# Patient Record
Sex: Female | Born: 1995 | Hispanic: Yes | Marital: Single | Smoking: Former smoker
Health system: Southern US, Community
[De-identification: ages and names within clinical notes are randomized; demographics above are authoritative.]

## PROBLEM LIST (undated history)

## (undated) ENCOUNTER — Inpatient Hospital Stay (HOSPITAL_COMMUNITY): Payer: Self-pay

## (undated) DIAGNOSIS — J45909 Unspecified asthma, uncomplicated: Secondary | ICD-10-CM

## (undated) DIAGNOSIS — R87629 Unspecified abnormal cytological findings in specimens from vagina: Secondary | ICD-10-CM

---
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
## 2018-05-31 ENCOUNTER — Encounter (HOSPITAL_COMMUNITY): Payer: Self-pay

## 2018-05-31 ENCOUNTER — Inpatient Hospital Stay (HOSPITAL_COMMUNITY)
Admission: AD | Admit: 2018-05-31 | Discharge: 2018-05-31 | Disposition: A | Discharge status: Home / Self Care | Payer: Medicaid Other | Source: Ambulatory Visit | Attending: Obstetrics & Gynecology | Admitting: Obstetrics & Gynecology

## 2018-05-31 DIAGNOSIS — Z3A29 29 weeks gestation of pregnancy: Secondary | ICD-10-CM | POA: Diagnosis not present

## 2018-05-31 DIAGNOSIS — O26893 Other specified pregnancy related conditions, third trimester: Secondary | ICD-10-CM | POA: Diagnosis not present

## 2018-05-31 DIAGNOSIS — R109 Unspecified abdominal pain: Secondary | ICD-10-CM | POA: Diagnosis not present

## 2018-05-31 DIAGNOSIS — O26899 Other specified pregnancy related conditions, unspecified trimester: Secondary | ICD-10-CM | POA: Diagnosis not present

## 2018-05-31 DIAGNOSIS — M549 Dorsalgia, unspecified: Secondary | ICD-10-CM | POA: Diagnosis present

## 2018-05-31 LAB — URINALYSIS, ROUTINE W REFLEX MICROSCOPIC
BILIRUBIN URINE: NEGATIVE
Glucose, UA: NEGATIVE mg/dL
HGB URINE DIPSTICK: NEGATIVE
Ketones, ur: NEGATIVE mg/dL
Leukocytes, UA: NEGATIVE
Nitrite: NEGATIVE
Protein, ur: NEGATIVE mg/dL
SPECIFIC GRAVITY, URINE: 1.017 (ref 1.005–1.030)
pH: 7 (ref 5.0–8.0)

## 2018-05-31 NOTE — MAU Provider Note (Signed)
Chief Complaint:  Abdominal Pain and Back Pain   First Provider Initiated Contact with Patient 05/31/18 1649     HPI: [name] is a 22 y.o. G1P0 at 4958w4d who presents to maternity admissions reporting lower abdominal cramping & low back pain. Symptoms began yesterday after her OB visit at [facility]. Symptoms are intermittently and do not occur more than once per hour. Vomited once this morning after she ate hibachi. Denies n/v since then. Denies dysuria, fever/chills, vaginal bleeding, vaginal discharge, diarrhea, constipation, or recent intercourse. Normal fetal movement.   Location: lower abdomen & back Quality: cramping Severity: 5/10 in pain scale Duration: 1 day Timing: a few times per day Modifying factors: none Associated signs and symptoms: none  History reviewed. No pertinent past medical history. OB History  Gravida Para Term Preterm AB Living  1            SAB TAB Ectopic Multiple Live Births               # Outcome Date GA Lbr Len/2nd Weight Sex Delivery Anes PTL Lv  1 Current            History reviewed. No pertinent surgical history. History reviewed. No pertinent family history. Social History   Tobacco Use  . Smoking status: Never Smoker  . Smokeless tobacco: Never Used  Substance Use Topics  . Alcohol use: Never    Frequency: Never  . Drug use: Never   No Known Allergies No medications prior to admission.    I have reviewed patient's Past Medical Hx, Surgical Hx, Family Hx, Social Hx, medications and allergies.   ROS:  Review of Systems  Constitutional: Negative.   Gastrointestinal: Positive for abdominal pain and vomiting (once this morning). Negative for constipation, diarrhea and nausea.  Genitourinary: Negative.   Musculoskeletal: Positive for back pain.    Physical Exam   Patient Vitals for the past 24 hrs:  BP Temp Temp src Pulse Resp Height Weight  05/31/18 1743 107/61 - - - - - -  05/31/18 1621 124/69 98.3 F (36.8 C) Oral 87  18 - -  05/31/18 1612 - - - - - 5\' 7"  (1.702 m) 90.3 kg    Constitutional: Well-developed, well-nourished female in no acute distress.  Cardiovascular: normal rate & rhythm, no murmur Respiratory: normal effort, lung sounds clear throughout GI: Abd soft, non-tender, gravid appropriate for gestational age. Pos BS x 4 MS: Extremities nontender, no edema, normal ROM Neurologic: Alert and oriented x 4.  GU:      Dilation: Closed Exam by:: Lyman BishopLawrence, NP  NST:  Baseline: 145 bpm, Variability: Good {> 6 bpm), Accelerations: Reactive and Decelerations: Absent   Labs: Results for orders placed or performed during the hospital encounter of 05/31/18 (from the past 24 hour(s))  Urinalysis, Routine w reflex microscopic     Status: Abnormal   Collection Time: 05/31/18  4:36 PM  Result Value Ref Range   Color, Urine AMBER (A) YELLOW   APPearance CLOUDY (A) CLEAR   Specific Gravity, Urine 1.017 1.005 - 1.030   pH 7.0 5.0 - 8.0   Glucose, UA NEGATIVE NEGATIVE mg/dL   Hgb urine dipstick NEGATIVE NEGATIVE   Bilirubin Urine NEGATIVE NEGATIVE   Ketones, ur NEGATIVE NEGATIVE mg/dL   Protein, ur NEGATIVE NEGATIVE mg/dL   Nitrite NEGATIVE NEGATIVE   Leukocytes, UA NEGATIVE NEGATIVE    Imaging:  No results found.  MAU Course: Orders Placed This Encounter  Procedures  . Urinalysis, Routine w  reflex microscopic  . Discharge patient   No orders of the defined types were placed in this encounter.   MDM: Reactive tracing. No ctx. Cervix closed/thick/posterier  Assessment: 1. Abdominal cramping affecting pregnancy   2. [redacted] weeks gestation of pregnancy     Plan: Discharge home in stable condition.  Preterm Labor precautions and fetal kick counts Follow-up Information    Department, [facility] Follow up.   Contact information: [address] [place] [address] [phone]           Allergies as of 05/31/2018   No Known Allergies     Medication List    You  have not been prescribed any medications.     Judeth Horn, NP 05/31/2018 7:30 PM

## 2018-05-31 NOTE — MAU Note (Signed)
Pt presents to MAU with c/o lower back pain and abdominal pain that started today around 1430. Pt describes pain as cramping that comes and goes. Pt denies VB and LOF. +FM

## 2018-05-31 NOTE — Discharge Instructions (Signed)

## 2018-06-19 NOTE — L&D Delivery Note (Signed)
Patient is a 23 y.o. now G1P1 s/p NSVD at [redacted]w[redacted]d, who was admitted for SOL.  Delivery Note At 6:00 AM a viable female was delivered via Vaginal, Spontaneous (Presentation: LOA).  APGAR: 8, 9; weight pending.   Placenta status: intact.  Cord: 3-vessels with the following complications: none.  Anesthesia:   Episiotomy: None Lacerations: Periurethral;Perineal;1st degree Suture Repair: 4-0 monocryl Est. Blood Loss (mL):  Head delivered LOA. No nuchal cord present. Shoulder and body delivered in usual fashion. Infant with spontaneous cry, placed on mother's abdomen, dried and bulb suctioned. Cord clamped x 2 after 1-minute delay, and cut by family member. Cord blood drawn. Placenta delivered spontaneously with gentle cord traction. Fundus firm with massage and Pitocin. Perineum inspected and found to have 1st degree laceration, which was repaired with 4-0 monocryl with good hemostasis achieved.  Mom to postpartum.  Baby to Couplet care / Skin to Skin.  [facility], PGY-2 08/12/2018, 6:39 AM

## 2018-08-10 ENCOUNTER — Inpatient Hospital Stay (HOSPITAL_BASED_OUTPATIENT_CLINIC_OR_DEPARTMENT_OTHER): Payer: Medicaid Other

## 2018-08-10 ENCOUNTER — Encounter (HOSPITAL_COMMUNITY): Payer: Self-pay | Admitting: *Deleted

## 2018-08-10 ENCOUNTER — Inpatient Hospital Stay (EMERGENCY_DEPARTMENT_HOSPITAL)
Admission: AD | Admit: 2018-08-10 | Discharge: 2018-08-10 | Disposition: A | Discharge status: Home / Self Care | Payer: Medicaid Other | Source: Ambulatory Visit | Attending: Obstetrics & Gynecology | Admitting: Obstetrics & Gynecology

## 2018-08-10 DIAGNOSIS — O4693 Antepartum hemorrhage, unspecified, third trimester: Secondary | ICD-10-CM

## 2018-08-10 DIAGNOSIS — O368330 Maternal care for abnormalities of the fetal heart rate or rhythm, third trimester, not applicable or unspecified: Secondary | ICD-10-CM

## 2018-08-10 DIAGNOSIS — O368390 Maternal care for abnormalities of the fetal heart rate or rhythm, unspecified trimester, not applicable or unspecified: Secondary | ICD-10-CM

## 2018-08-10 DIAGNOSIS — O471 False labor at or after 37 completed weeks of gestation: Secondary | ICD-10-CM

## 2018-08-10 DIAGNOSIS — O368130 Decreased fetal movements, third trimester, not applicable or unspecified: Secondary | ICD-10-CM

## 2018-08-10 DIAGNOSIS — O9989 Other specified diseases and conditions complicating pregnancy, childbirth and the puerperium: Secondary | ICD-10-CM | POA: Diagnosis not present

## 2018-08-10 DIAGNOSIS — Z3A38 38 weeks gestation of pregnancy: Secondary | ICD-10-CM

## 2018-08-10 DIAGNOSIS — O479 False labor, unspecified: Secondary | ICD-10-CM

## 2018-08-10 DIAGNOSIS — Z0371 Encounter for suspected problem with amniotic cavity and membrane ruled out: Secondary | ICD-10-CM

## 2018-08-10 LAB — AMNISURE RUPTURE OF MEMBRANE (ROM) NOT AT ARMC: Amnisure ROM: NEGATIVE

## 2018-08-10 LAB — POCT FERN TEST: POCT Fern Test: NEGATIVE

## 2018-08-10 NOTE — MAU Note (Signed)
Kari Campbell is a 23 y.o. at [redacted]w[redacted]d here in MAU reporting: started having contractions this AM, having some pink discharge. States she feels the contractions every couple of minutes. Reports she feels some continuous leaking of fluid. Decreased fetal movement today.  Onset of complaint: this morning  Pain score: 8/10  Vitals:   08/10/18 0951  BP: 125/80  Pulse: 100  Resp: 18  Temp: 98 F (36.7 C)  SpO2: 100%      FHT:134  Lab orders placed from triage: none

## 2018-08-10 NOTE — Discharge Instructions (Signed)

## 2018-08-10 NOTE — MAU Provider Note (Addendum)
Chief Complaint:  Contractions and Decreased Fetal Movement   First Provider Initiated Contact with Patient 08/10/18 1048      HPI: Kari Campbell is a 23 y.o. G1P0 at [redacted]w[redacted]d by LMP who presents to maternity admissions reporting pink discharge, painful contractions, and baby moving less than usual this morning. She reports contractions are becoming more intense but not more frequent. The pain is low in her abdomen and intermittent.  She is feeling more fetal movement since arrival in MAU.  She has not tried any treatments, there are no other symptoms.  HPI  Past Medical History: History reviewed. No pertinent past medical history.  Past obstetric history: OB History  Gravida Para Term Preterm AB Living  1            SAB TAB Ectopic Multiple Live Births               # Outcome Date GA Lbr Len/2nd Weight Sex Delivery Anes PTL Lv  1 Current             Past Surgical History: History reviewed. No pertinent surgical history.  Family History: History reviewed. No pertinent family history.  Social History: Social History   Tobacco Use  . Smoking status: Never Smoker  . Smokeless tobacco: Never Used  Substance Use Topics  . Alcohol use: Never    Frequency: Never  . Drug use: Never    Allergies: No Known Allergies  Meds:  No medications prior to admission.    ROS:  Review of Systems  Constitutional: Negative for chills, fatigue and fever.  Eyes: Negative for visual disturbance.  Respiratory: Negative for shortness of breath.   Cardiovascular: Negative for chest pain.  Gastrointestinal: Positive for abdominal pain. Negative for nausea and vomiting.  Genitourinary: Negative for difficulty urinating, dysuria, flank pain, pelvic pain, vaginal bleeding, vaginal discharge and vaginal pain.  Neurological: Negative for dizziness and headaches.  Psychiatric/Behavioral: Negative.      I have reviewed patient's Past Medical Hx, Surgical Hx, Family Hx, Social Hx,  medications and allergies.   Physical Exam   Patient Vitals for the past 24 hrs:  BP Temp Temp src Pulse Resp SpO2 Height Weight  08/10/18 0951 125/80 98 F (36.7 C) Oral 100 18 100 % - -  08/10/18 0946 - - - - - - 5' 7.5" (1.715 m) 94.7 kg   Constitutional: Well-developed, well-nourished female in no acute distress.  Cardiovascular: normal rate Respiratory: normal effort GI: Abd soft, non-tender, gravid appropriate for gestational age.  MS: Extremities nontender, no edema, normal ROM Neurologic: Alert and oriented x 4.  GU: Neg CVAT.  PELVIC EXAM: Cervix pink, visually closed, without lesion, scant white creamy discharge, vaginal walls and external genitalia normal Bimanual exam: Cervix 0/long/high, firm, anterior, neg CMT, uterus nontender, nonenlarged, adnexa without tenderness, enlargement, or mass  Dilation: 1 Effacement (%): 70 Station: -2 Presentation: Vertex Exam by:: Sharen Counter, CNM  FHT:  Baseline 145 , moderate variability, accelerations present, occasional variables.  After [place], pt tracing reactive with no decelerations. Contractions: q 3-10 mins   Labs: Results for orders placed or performed during the hospital encounter of 08/10/18 (from the past 24 hour(s))  Amnisure rupture of membrane (rom)not at [facility]     Status: None   Collection Time: 08/10/18 11:12 AM  Result Value Ref Range   Amnisure ROM NEGATIVE   POCT fern test     Status: None   Collection Time: 08/10/18 11:42 AM  Result Value Ref Range  POCT Fern Test Negative = intact amniotic membranes       Imaging:  No results found.  MAU Course/MDM: Orders Placed This Encounter  Procedures  . [place] MFM OB LIMITED  . [place] MFM FETAL BPP WO NON STRESS  . Amnisure rupture of membrane (rom)not at [facility]  . Contraction - monitoring  . External fetal heart monitoring  . Vaginal exam  . POCT fern test  . Discharge patient    No orders of the defined types were placed in this encounter.    NST  reviewed. Overall Category I with very isolated variables, no variables after pt returned from [place]. No evidence of labor with cervix unchanged in 2+ hours in MAU Consult Dr Debroah Loop with presentation, exam findings and test results.  With 8/10 given now reactive NST, pt Ok for discharge D/C home with labor precautions and fetal kick counts   Assessment: 1. Threatened labor at term   2. Variable fetal heart rate decelerations, antepartum   3. Vaginal bleeding in pregnancy, third trimester   4. Encounter for suspected premature rupture of amniotic membranes, with rupture of membranes not found     Plan: Discharge home Labor precautions and fetal kick counts  Follow-up Information    Department, [facility] Follow up.   Contact information: [address] [place] [address] [phone]        [facility] Follow up.   Why:  With signs of labor or emergencies BEFORE 5 am on 08/11/18.  Contact information: [address] [place] [address] [phone]       [facility] Follow up.   Why:  With signs of labor or emergencies AFTER 5 am on 08/11/18. Contact information: [address] [place] [address] [phone]         Allergies as of 08/10/2018   No Known Allergies     Medication List    You have not been prescribed any medications.     Sharen Counter Certified Nurse-Midwife 08/10/2018 4:15 PM

## 2018-08-11 ENCOUNTER — Encounter (HOSPITAL_COMMUNITY): Payer: Self-pay | Admitting: *Deleted

## 2018-08-11 ENCOUNTER — Inpatient Hospital Stay (HOSPITAL_COMMUNITY)
Admission: AD | Admit: 2018-08-11 | Discharge: 2018-08-11 | Disposition: A | Discharge status: Home / Self Care | Payer: Medicaid Other | Source: Home / Self Care | Attending: Obstetrics & Gynecology | Admitting: Obstetrics & Gynecology

## 2018-08-11 ENCOUNTER — Other Ambulatory Visit: Payer: Self-pay

## 2018-08-11 ENCOUNTER — Inpatient Hospital Stay (HOSPITAL_COMMUNITY)
Admission: AD | Admit: 2018-08-11 | Discharge: 2018-08-14 | DRG: 807 | Disposition: A | Discharge status: Home / Self Care | Payer: Medicaid Other | Attending: Obstetrics & Gynecology | Admitting: Obstetrics & Gynecology

## 2018-08-11 DIAGNOSIS — Z6791 Unspecified blood type, Rh negative: Secondary | ICD-10-CM | POA: Diagnosis not present

## 2018-08-11 DIAGNOSIS — Z3A39 39 weeks gestation of pregnancy: Secondary | ICD-10-CM | POA: Diagnosis not present

## 2018-08-11 DIAGNOSIS — Z3A38 38 weeks gestation of pregnancy: Secondary | ICD-10-CM | POA: Insufficient documentation

## 2018-08-11 DIAGNOSIS — O471 False labor at or after 37 completed weeks of gestation: Secondary | ICD-10-CM | POA: Insufficient documentation

## 2018-08-11 DIAGNOSIS — O4693 Antepartum hemorrhage, unspecified, third trimester: Secondary | ICD-10-CM | POA: Diagnosis not present

## 2018-08-11 DIAGNOSIS — O26893 Other specified pregnancy related conditions, third trimester: Secondary | ICD-10-CM | POA: Diagnosis present

## 2018-08-11 DIAGNOSIS — O479 False labor, unspecified: Secondary | ICD-10-CM

## 2018-08-11 DIAGNOSIS — O368330 Maternal care for abnormalities of the fetal heart rate or rhythm, third trimester, not applicable or unspecified: Secondary | ICD-10-CM | POA: Diagnosis not present

## 2018-08-11 LAB — CBC
HCT: 37 % (ref 36.0–46.0)
Hemoglobin: 11.9 g/dL — ABNORMAL LOW (ref 12.0–15.0)
MCH: 28.6 pg (ref 26.0–34.0)
MCHC: 32.2 g/dL (ref 30.0–36.0)
MCV: 88.9 fL (ref 80.0–100.0)
Platelets: 129 10*3/uL — ABNORMAL LOW (ref 150–400)
RBC: 4.16 MIL/uL (ref 3.87–5.11)
RDW: 13.8 % (ref 11.5–15.5)
WBC: 14.6 10*3/uL — ABNORMAL HIGH (ref 4.0–10.5)
nRBC: 0 % (ref 0.0–0.2)

## 2018-08-11 LAB — URINALYSIS, ROUTINE W REFLEX MICROSCOPIC
Bilirubin Urine: NEGATIVE
GLUCOSE, UA: NEGATIVE mg/dL
Ketones, ur: NEGATIVE mg/dL
Nitrite: NEGATIVE
Protein, ur: 30 mg/dL — AB
Specific Gravity, Urine: 1.021 (ref 1.005–1.030)
pH: 7 (ref 5.0–8.0)

## 2018-08-11 MED ORDER — OXYCODONE-ACETAMINOPHEN 5-325 MG PO TABS: 2.0000 | ORAL_TABLET | ORAL | Status: DC | PRN

## 2018-08-11 MED ORDER — ACETAMINOPHEN 325 MG PO TABS: 650.0000 mg | ORAL_TABLET | ORAL | Status: DC | PRN

## 2018-08-11 MED ORDER — OXYCODONE-ACETAMINOPHEN 5-325 MG PO TABS: 1.0000 | ORAL_TABLET | ORAL | Status: DC | PRN

## 2018-08-11 MED ORDER — OXYTOCIN BOLUS FROM INFUSION
500.0000 mL | Freq: Once | INTRAVENOUS | Status: AC
  Administered 2018-08-12: 500 mL via INTRAVENOUS

## 2018-08-11 MED ORDER — ONDANSETRON HCL 4 MG/2ML IJ SOLN: 4.0000 mg | Freq: Four times a day (QID) | INTRAMUSCULAR | Status: DC | PRN

## 2018-08-11 MED ORDER — FENTANYL CITRATE (PF) 100 MCG/2ML IJ SOLN: 50.0000 ug | INTRAMUSCULAR | Status: DC | PRN

## 2018-08-11 MED ORDER — LACTATED RINGERS IV SOLN
INTRAVENOUS | Status: DC
  Administered 2018-08-11 – 2018-08-12 (×2): via INTRAVENOUS

## 2018-08-11 MED ORDER — LACTATED RINGERS IV SOLN: 500.0000 mL | INTRAVENOUS | Status: DC | PRN

## 2018-08-11 MED ORDER — LIDOCAINE HCL (PF) 1 % IJ SOLN
30.0000 mL | INTRAMUSCULAR | Status: DC | PRN
  Filled 2018-08-11: qty 30

## 2018-08-11 MED ORDER — OXYTOCIN 40 UNITS IN NORMAL SALINE INFUSION - SIMPLE MED
2.5000 [IU]/h | INTRAVENOUS | Status: DC
  Filled 2018-08-11: qty 1000

## 2018-08-11 MED ORDER — SOD CITRATE-CITRIC ACID 500-334 MG/5ML PO SOLN: 30.0000 mL | ORAL | Status: DC | PRN

## 2018-08-11 NOTE — MAU Note (Signed)
Pt reports contractions every 2-3 mins. Denies LOF. Reports blood show. Cervix was 2cm this morning. Reports good fetal movement.

## 2018-08-11 NOTE — MAU Provider Note (Signed)
RN Labor Evaluation  Briefly, this is a 22yo G1P0 at [redacted]w[redacted]d who presented to MAU complaining of contractions. Denied leaking fluid, vaginal bleeding and reported normal fetal movement. Cervical exam unchanged after about 2 hours -2.5/80/-2. Fetal tracing reactive 155/mod/+a/-dl contractions irregular every 2-5 minutes. Patient not in active labor and stable for discharge home. Labor precautions reviewed. Encouraged to follow-up with prenatal provider at [facility].   Cristal Deer. Earlene Plater, DO OB/GYN Fellow

## 2018-08-11 NOTE — H&P (Addendum)
LABOR AND DELIVERY ADMISSION HISTORY AND PHYSICAL NOTE  Kari Campbell is a 23 y.o. female G1P0 with IUP at [redacted]w[redacted]d by LMP presenting for SOL of labor. She states her contractions became more intense and were coming at regular intervals.  She reports positive fetal movement. She denies leakage of fluid or vaginal bleeding.  Prenatal History/Complications: PNC at [facility] Pregnancy complications:  - Varicella non-immune - ACUS at last pap smear on 03/2018  Past Medical History: History reviewed. No pertinent past medical history.  Past Surgical History: History reviewed. No pertinent surgical history.  Obstetrical History: OB History    Gravida  1   Para      Term      Preterm      AB      Living        SAB      TAB      Ectopic      Multiple      Live Births              Social History: Social History   Socioeconomic History  . Marital status: Single    Spouse name: Not on file  . Number of children: Not on file  . Years of education: Not on file  . Highest education level: Not on file  Occupational History  . Not on file  Social Needs  . Financial resource strain: Not on file  . Food insecurity:    Worry: Not on file    Inability: Not on file  . Transportation needs:    Medical: Not on file    Non-medical: Not on file  Tobacco Use  . Smoking status: Never Smoker  . Smokeless tobacco: Never Used  Substance and Sexual Activity  . Alcohol use: Never    Frequency: Never  . Drug use: Never  . Sexual activity: Yes  Lifestyle  . Physical activity:    Days per week: Not on file    Minutes per session: Not on file  . Stress: Not on file  Relationships  . Social connections:    Talks on phone: Not on file    Gets together: Not on file    Attends religious service: Not on file    Active member of club or organization: Not on file    Attends meetings of clubs or organizations: Not on file    Relationship status: Not on file  Other Topics  Concern  . Not on file  Social History Narrative  . Not on file    Family History: History reviewed. No pertinent family history.  Allergies: No Known Allergies  Medications Prior to Admission  Medication Sig Dispense Refill Last Dose  . Prenatal Vit-Fe Fumarate-FA (PRENATAL COMPLETE) 14-0.4 MG TABS Take by mouth.      Review of Systems  All systems reviewed and negative except as stated in HPI  Physical Exam Blood pressure 134/79, pulse (!) 121, temperature 98.8 F (37.1 C), temperature source Oral, resp. rate 20, height 5\' 7"  (1.702 m), weight 94.7 kg, SpO2 100 %. General appearance: alert, cooperative and no distress Lungs: normal work of breathing, speaking in full sentences Abdomen: gravid Extremities: No calf swelling or tenderness Presentation: cephalic confirmed via U/S on 09/09/5571 Fetal monitoring: Cat 1 Uterine activity: irregular contractions Dilation: 5 Effacement (%): 100 Station: -1 Exam by:: Lauren Cox RN   Prenatal labs: ABO, Rh:  O+ Antibody:  Neg Rubella:  Equivcol RPR:   Neg HBsAg:   Neg HIV:  Non-reactive GC/Chlamydia: Neg GBS:   Neg 1 hr Glucola: 79 Genetic screening:  Normal Anatomy US: not available for review  Prenatal Transfer Tool  Maternal Diabetes: No Genetic Screening: Normal Maternal Ultrasounds/Referrals: Normal Fetal Ultrasounds or other Referrals:  None Maternal Substance Abuse:  No Significant Maternal Medications:  None Significant Maternal Lab Results: Lab values include: Group B Strep negative, Rh negative, Other: Varicella non-immune  Results for orders placed or performed during the hospital encounter of 08/11/18 (from the past 24 hour(s))  CBC   Collection Time: 08/11/18 10:50 PM  Result Value Ref Range   WBC 14.6 (H) 4.0 - 10.5 K/uL   RBC 4.16 3.87 - 5.11 MIL/uL   Hemoglobin 11.9 (L) 12.0 - 15.0 g/dL   HCT 68.0 88.1 - 10.3 %   MCV 88.9 80.0 - 100.0 fL   MCH 28.6 26.0 - 34.0 pg   MCHC 32.2 30.0 - 36.0 g/dL    RDW 15.9 45.8 - 59.2 %   Platelets 129 (L) 150 - 400 K/uL   nRBC 0.0 0.0 - 0.2 %  Results for orders placed or performed during the hospital encounter of 08/11/18 (from the past 24 hour(s))  Urinalysis, Routine w reflex microscopic   Collection Time: 08/11/18  2:47 PM  Result Value Ref Range   Color, Urine YELLOW YELLOW   APPearance HAZY (A) CLEAR   Specific Gravity, Urine 1.021 1.005 - 1.030   pH 7.0 5.0 - 8.0   Glucose, UA NEGATIVE NEGATIVE mg/dL   Hgb urine dipstick MODERATE (A) NEGATIVE   Bilirubin Urine NEGATIVE NEGATIVE   Ketones, ur NEGATIVE NEGATIVE mg/dL   Protein, ur 30 (A) NEGATIVE mg/dL   Nitrite NEGATIVE NEGATIVE   Leukocytes,Ua MODERATE (A) NEGATIVE   RBC / HPF 0-5 0 - 5 RBC/hpf   WBC, UA 21-50 0 - 5 WBC/hpf   Bacteria, UA FEW (A) NONE SEEN   Squamous Epithelial / LPF 6-10 0 - 5   Mucus PRESENT    Amorphous Crystal PRESENT     Patient Active Problem List   Diagnosis Date Noted  . Normal labor and delivery 08/11/2018    Assessment: [name] is a 23 y.o. G1P0 at [redacted]w[redacted]d here for SOL  #Labor: expectant management, SROM @ 11:50pm #Pain: Analgesic/anethsia, epidural on request #FWB: Cat 1 #ID: [id number] #MOF: ? #MOC: ? #Circ:   Arlyce Harman 08/11/2018, 11:54 PM  I confirm that I have verified the information documented in the resident's note and that I have also personally reperformed the history, physical exam and all medical decision making activities of this service and have verified that all service and findings are accurately documented in this student's note.   Rolm Bookbinder, [place] 08/12/2018 1:34 AM

## 2018-08-11 NOTE — MAU Note (Signed)
Pt presents with contractions every 5 mins with bloody show . +FM

## 2018-08-12 ENCOUNTER — Inpatient Hospital Stay (HOSPITAL_COMMUNITY): Payer: Medicaid Other | Admitting: Anesthesiology

## 2018-08-12 ENCOUNTER — Encounter (HOSPITAL_COMMUNITY): Payer: Self-pay | Admitting: *Deleted

## 2018-08-12 DIAGNOSIS — Z3A39 39 weeks gestation of pregnancy: Secondary | ICD-10-CM

## 2018-08-12 LAB — CBC
HCT: 34.1 % — ABNORMAL LOW (ref 36.0–46.0)
Hemoglobin: 10.7 g/dL — ABNORMAL LOW (ref 12.0–15.0)
MCH: 28.8 pg (ref 26.0–34.0)
MCHC: 31.4 g/dL (ref 30.0–36.0)
MCV: 91.7 fL (ref 80.0–100.0)
NRBC: 0 % (ref 0.0–0.2)
Platelets: 137 10*3/uL — ABNORMAL LOW (ref 150–400)
RBC: 3.72 MIL/uL — ABNORMAL LOW (ref 3.87–5.11)
RDW: 14 % (ref 11.5–15.5)
WBC: 16.3 10*3/uL — ABNORMAL HIGH (ref 4.0–10.5)

## 2018-08-12 LAB — RPR: RPR: NONREACTIVE

## 2018-08-12 MED ORDER — ONDANSETRON HCL 4 MG/2ML IJ SOLN: 4.0000 mg | INTRAMUSCULAR | Status: DC | PRN

## 2018-08-12 MED ORDER — PHENYLEPHRINE 40 MCG/ML (10ML) SYRINGE FOR IV PUSH (FOR BLOOD PRESSURE SUPPORT): 80.0000 ug | PREFILLED_SYRINGE | INTRAVENOUS | Status: DC | PRN

## 2018-08-12 MED ORDER — SENNOSIDES-DOCUSATE SODIUM 8.6-50 MG PO TABS
2.0000 | ORAL_TABLET | ORAL | Status: DC
  Administered 2018-08-13 – 2018-08-14 (×2): 2 via ORAL
  Filled 2018-08-12 (×2): qty 2

## 2018-08-12 MED ORDER — SODIUM CHLORIDE (PF) 0.9 % IJ SOLN
INTRAMUSCULAR | Status: DC | PRN
  Administered 2018-08-12: 12 mL/h via EPIDURAL

## 2018-08-12 MED ORDER — FENTANYL-BUPIVACAINE-NACL 0.5-0.125-0.9 MG/250ML-% EP SOLN
12.0000 mL/h | EPIDURAL | Status: DC | PRN
  Filled 2018-08-12: qty 250

## 2018-08-12 MED ORDER — DIPHENHYDRAMINE HCL 25 MG PO CAPS: 25.0000 mg | ORAL_CAPSULE | Freq: Four times a day (QID) | ORAL | Status: DC | PRN

## 2018-08-12 MED ORDER — IBUPROFEN 600 MG PO TABS
600.0000 mg | ORAL_TABLET | Freq: Four times a day (QID) | ORAL | Status: DC
  Administered 2018-08-12 – 2018-08-14 (×9): 600 mg via ORAL
  Filled 2018-08-12 (×9): qty 1

## 2018-08-12 MED ORDER — ONDANSETRON HCL 4 MG PO TABS: 4.0000 mg | ORAL_TABLET | ORAL | Status: DC | PRN

## 2018-08-12 MED ORDER — DIPHENHYDRAMINE HCL 50 MG/ML IJ SOLN: 12.5000 mg | INTRAMUSCULAR | Status: DC | PRN

## 2018-08-12 MED ORDER — LACTATED RINGERS IV SOLN
500.0000 mL | Freq: Once | INTRAVENOUS | Status: AC
  Administered 2018-08-12: 500 mL via INTRAVENOUS

## 2018-08-12 MED ORDER — FENTANYL-BUPIVACAINE-NACL 0.5-0.125-0.9 MG/250ML-% EP SOLN: 12.0000 mL/h | EPIDURAL | Status: DC | PRN

## 2018-08-12 MED ORDER — SIMETHICONE 80 MG PO CHEW: 80.0000 mg | CHEWABLE_TABLET | ORAL | Status: DC | PRN

## 2018-08-12 MED ORDER — TETANUS-DIPHTH-ACELL PERTUSSIS 5-2.5-18.5 LF-MCG/0.5 IM SUSP: 0.5000 mL | Freq: Once | INTRAMUSCULAR | Status: DC

## 2018-08-12 MED ORDER — EPHEDRINE 5 MG/ML INJ: 10.0000 mg | INTRAVENOUS | Status: DC | PRN

## 2018-08-12 MED ORDER — COCONUT OIL OIL
1.0000 "application " | TOPICAL_OIL | Status: DC | PRN
  Administered 2018-08-13: 1 via TOPICAL

## 2018-08-12 MED ORDER — WITCH HAZEL-GLYCERIN EX PADS: 1.0000 "application " | MEDICATED_PAD | CUTANEOUS | Status: DC | PRN

## 2018-08-12 MED ORDER — ZOLPIDEM TARTRATE 5 MG PO TABS: 5.0000 mg | ORAL_TABLET | Freq: Every evening | ORAL | Status: DC | PRN

## 2018-08-12 MED ORDER — ACETAMINOPHEN 325 MG PO TABS
650.0000 mg | ORAL_TABLET | ORAL | Status: DC | PRN
  Administered 2018-08-13: 650 mg via ORAL
  Filled 2018-08-12: qty 2

## 2018-08-12 MED ORDER — PRENATAL MULTIVITAMIN CH
1.0000 | ORAL_TABLET | Freq: Every day | ORAL | Status: DC
  Administered 2018-08-12 – 2018-08-14 (×3): 1 via ORAL
  Filled 2018-08-12 (×3): qty 1

## 2018-08-12 MED ORDER — DIBUCAINE 1 % RE OINT: 1.0000 "application " | TOPICAL_OINTMENT | RECTAL | Status: DC | PRN

## 2018-08-12 MED ORDER — LIDOCAINE HCL (PF) 1 % IJ SOLN
INTRAMUSCULAR | Status: DC | PRN
  Administered 2018-08-12 (×2): 5 mL via EPIDURAL

## 2018-08-12 MED ORDER — BENZOCAINE-MENTHOL 20-0.5 % EX AERO
1.0000 "application " | INHALATION_SPRAY | CUTANEOUS | Status: DC | PRN
  Administered 2018-08-13: 1 via TOPICAL
  Filled 2018-08-12: qty 56

## 2018-08-12 NOTE — Progress Notes (Signed)
Kari Campbell is a 23 y.o. G1P0 at [redacted]w[redacted]d.  Subjective: Patient is resting comfortably in bed, doing well. Agreed to AROM to help labor.  Objective: BP 132/73   Pulse (!) 119   Temp 98.1 F (36.7 C) (Axillary)   Resp 20   Ht 5\' 7"  (1.702 m)   Wt 94.7 kg   SpO2 98%   BMI 32.69 kg/m    FHT:  FHR: 145 bpm, variability: mod,  accelerations: present,  decelerations: absent UC: 4-6 min Dilation: 9 Effacement (%): 100 Cervical Position: Anterior Station: -1 Presentation: Vertex Exam by:: Suezanne Jacquet, RN  Labs: Results for orders placed or performed during the hospital encounter of 08/11/18 (from the past 24 hour(s))  Type and screen [facility]     Status: None   Collection Time: 08/11/18 10:47 PM  Result Value Ref Range   ABO/RH(D) O POS    Antibody Screen NEG    Sample Expiration      08/14/2018 Performed at [facility], [address], [place], [place] [address]   ABO/Rh     Status: None   Collection Time: 08/11/18 10:47 PM  Result Value Ref Range   ABO/RH(D)      O POS Performed at [facility], [address] [place], [place] [address]   CBC     Status: Abnormal   Collection Time: 08/11/18 10:50 PM  Result Value Ref Range   WBC 14.6 (H) 4.0 - 10.5 K/uL   RBC 4.16 3.87 - 5.11 MIL/uL   Hemoglobin 11.9 (L) 12.0 - 15.0 g/dL   HCT 22.3 36.1 - 22.4 %   MCV 88.9 80.0 - 100.0 fL   MCH 28.6 26.0 - 34.0 pg   MCHC 32.2 30.0 - 36.0 g/dL   RDW 49.7 53.0 - 05.1 %   Platelets 129 (L) 150 - 400 K/uL   nRBC 0.0 0.0 - 0.2 %    Assessment / Plan: [redacted]w[redacted]d week IUP Labor: AROM at 0300, expectant management Fetal Wellbeing:  Category 1 Pain Control:  Epidural in place Anticipated MOD:  SVD  Arlyce Harman, DO  [facility], PGY-2 08/12/2018 3:01 AM

## 2018-08-12 NOTE — Anesthesia Postprocedure Evaluation (Signed)
Anesthesia Post Note  Patient: Kari Campbell  Procedure(s) Performed: AN AD HOC LABOR EPIDURAL     Patient location during evaluation: Mother Baby Anesthesia Type: Epidural Level of consciousness: awake Pain management: satisfactory to patient Vital Signs Assessment: post-procedure vital signs reviewed and stable Respiratory status: spontaneous breathing Cardiovascular status: stable Anesthetic complications: no    Last Vitals:  Vitals:   08/12/18 0945 08/12/18 1330  BP: 119/69 109/66  Pulse: (!) 108 87  Resp: 18 20  Temp: 36.8 C 36.7 C  SpO2:      Last Pain:  Vitals:   08/12/18 1411  TempSrc:   PainSc: 0-No pain   Pain Goal:                   [employer]

## 2018-08-12 NOTE — Anesthesia Preprocedure Evaluation (Signed)

## 2018-08-12 NOTE — Anesthesia Procedure Notes (Signed)
Epidural Patient location during procedure: OB  Staffing Anesthesiologist: Daquavion Catala, MD Performed: anesthesiologist   Preanesthetic Checklist Completed: patient identified, site marked, surgical consent, pre-op evaluation, timeout performed, IV checked, risks and benefits discussed and monitors and equipment checked  Epidural Patient position: sitting Prep: DuraPrep Patient monitoring: heart rate, continuous pulse ox and blood pressure Approach: right paramedian Location: L3-L4 Injection technique: LOR saline  Needle:  Needle type: Tuohy  Needle gauge: 17 G Needle length: 9 cm and 9 Needle insertion depth: 6 cm Catheter type: closed end flexible Catheter size: 20 Guage Catheter at skin depth: 10 cm Test dose: negative  Assessment Events: blood not aspirated, injection not painful, no injection resistance, negative IV test and no paresthesia  Additional Notes Patient identified. Risks/Benefits/Options discussed with patient including but not limited to bleeding, infection, nerve damage, paralysis, failed block, incomplete pain control, headache, blood pressure changes, nausea, vomiting, reactions to medication both or allergic, itching and postpartum back pain. Confirmed with bedside nurse the patient's most recent platelet count. Confirmed with patient that they are not currently taking any anticoagulation, have any bleeding history or any family history of bleeding disorders. Patient expressed understanding and wished to proceed. All questions were answered. Sterile technique was used throughout the entire procedure. Please see nursing notes for vital signs. Test dose was given through epidural needle and negative prior to continuing to dose epidural or start infusion. Warning signs of high block given to the patient including shortness of breath, tingling/numbness in hands, complete motor block, or any concerning symptoms with instructions to call for help. Patient was given  instructions on fall risk and not to get out of bed. All questions and concerns addressed with instructions to call with any issues.     

## 2018-08-12 NOTE — Lactation Note (Signed)
This note was copied from a baby's chart. Lactation Consultation Note  Patient Name: Kari Campbell Today's Date: 08/12/2018 Reason for consult: Initial assessment;1st time breastfeeding;Term P1, 16 hour female infant. Per parents, infant had 2 stools since delivery. Mom receives [facility] in [place]. Mom feels breastfeeding is going well and infant is latching without difficulty. LC did not observe latch at this time per mom, she had breastfeed infant 30 minutes 9:30 pm prior to [facility] entering the room, mom breastfeed for 15 minutes. LC discussed hand expression and mom taught back how to hand express, mom has colostrum present in breast. Mom knows to breastfeed according to hunger cues, breastfeed 8 or more times within 24 hours including nights. Mom knows to call Nurse or LC if she has any questions, concerns or need assistance with latching infant to breast. LC discussed I & O. Reviewed Baby & Me book's Breastfeeding Basics.  Mom made aware of O/P services, breastfeeding support groups, community resources, and our phone # for post-discharge questions.  Maternal Data Formula Feeding for Exclusion: Yes Reason for exclusion: Mother's choice to formula and breast feed on admission Has patient been taught Hand Expression?: Yes(Mom taught back hand expression colotrum present in breast..) Does the patient have breastfeeding experience prior to this delivery?: No  Feeding Feeding Type: Breast Fed  LATCH Score                   Interventions Interventions: Breast feeding basics reviewed;Hand express  Lactation Tools Discussed/Used WIC Program: Yes   Consult Status Consult Status: Follow-up Date: 08/13/18 Follow-up type: In-patient    Danelle Earthly 08/12/2018, 10:16 PM

## 2018-08-12 NOTE — Discharge Summary (Addendum)
Postpartum Discharge Summary     Patient Name: Kari Campbell DOB: 09/10/1995 MRN: [id number]  Date of admission: 08/11/2018 Delivering Provider: Rolm Bookbinder   Date of discharge: 08/12/2018  Admitting diagnosis: CTX Intrauterine pregnancy: [redacted]w[redacted]d     Secondary diagnosis:  Active Problems:   Normal labor and delivery  Additional problems: Varicella non-immune     Discharge diagnosis: Term Pregnancy Delivered                                                                                                Post partum procedures:n/a  Augmentation: AROM  Complications: None  Hospital course:  Onset of Labor With Vaginal Delivery     23 y.o. yo G1P0 at [redacted]w[redacted]d was admitted in Active Labor on 08/11/2018. Patient had an uncomplicated labor course as follows:  Membrane Rupture Time/Date: 2:58 AM ,08/12/2018   Intrapartum Procedures: Episiotomy: None [1]                                         Lacerations:  Periurethral [8];Perineal [11];1st degree [2]  Patient had a delivery of a Viable infant. 08/12/2018  Information for the patient's newborn:  Tarena, Jeske [[id number]]       Pateint had an uncomplicated postpartum course.  She is ambulating, tolerating a regular diet, passing flatus, and urinating well. Patient is discharged home in stable condition on 08/12/18.   Magnesium Sulfate recieved: No BMZ received: No  Physical exam  Vitals:   08/12/18 0501 08/12/18 0614 08/12/18 0631 08/12/18 0646  BP: (!) 100/50 107/63 (!) 118/58 (!) 111/58  Pulse: (!) 119 (!) 121 (!) 120 (!) 118  Resp:  19 18 17   Temp:   98.6 F (37 C)   TempSrc:   Oral   SpO2:  98%    Weight:      Height:       General: alert, cooperative and no distress Lochia: appropriate Uterine Fundus: firm Incision: N/A DVT Evaluation: No evidence of DVT seen on physical exam. No significant calf/ankle edema. Calf/Ankle edema is present Labs: Lab Results  Component Value Date   WBC  14.6 (H) 08/11/2018   HGB 11.9 (L) 08/11/2018   HCT 37.0 08/11/2018   MCV 88.9 08/11/2018   PLT 129 (L) 08/11/2018   CMP Latest Ref Rng & Units 10/24/2013  Glucose 65 - 99 mg/dL 86  BUN 9 - 21 mg/dL 13  Creatinine 5.07 - 2.25 mg/dL 7.50  Sodium 518 - 335 mmol/L 137  Potassium 3.3 - 4.7 mmol/L 4.3  Chloride 97 - 107 mmol/L 108(H)  CO2 16 - 25 mmol/L 25  Calcium 9.0 - 10.7 mg/dL 8.2(L)  Total Protein 6.4 - 8.6 g/dL 7.1  Total Bilirubin 0.2 - 1.0 mg/dL 0.4  Alkaline Phos Unit/L 75  AST 0 - 26 Unit/L 26  ALT 12 - 78 U/L 31    Discharge instruction: per After Visit Summary and "Baby and Me Booklet".  After visit meds:  Allergies as of 08/14/2018  No Known Allergies     Medication List    TAKE these medications   ibuprofen 600 MG tablet Commonly known as:  ADVIL,MOTRIN Take 1 tablet (600 mg total) by mouth every 6 (six) hours.   prenatal multivitamin Tabs tablet Take 1 tablet by mouth daily at 12 noon.   senna-docusate 8.6-50 MG tablet Commonly known as:  Senokot-S Take 2 tablets by mouth daily. Start taking on:  August 15, 2018       Diet: routine diet  Activity: Advance as tolerated. Pelvic rest for 6 weeks.   Outpatient follow up:4 weeks Follow up Appt:No future appointments. Follow up Visit:   Please schedule this patient for Postpartum visit in: 4 weeks with the following provider: Any provider For C/S patients schedule nurse incision check in weeks 2 weeks: no Low risk pregnancy complicated by: n/a Delivery mode:  SVD Anticipated Birth Control:  other/unsure PP Procedures needed: n/a  Schedule Integrated BH visit: no      Newborn Data: Live born female  Birth Weight:   APGAR: 8, 9  Newborn Delivery   Birth date/time:  08/12/2018 06:00:00 Delivery type:  Vaginal, Spontaneous     Baby Feeding: Breast Disposition:home with mother   Jules Schick, DO [facility], PGY-2 08/14/2018 8:10am  OB FELLOW DISCHARGE ATTESTATION  I have  seen and examined this patient and agree with above documentation in the resident's note.   Marcy Siren, D.O. OB Fellow  08/14/2018, 12:41 PM

## 2018-08-13 LAB — TYPE AND SCREEN
ABO/RH(D): O POS
Antibody Screen: NEGATIVE

## 2018-08-13 LAB — ABO/RH: ABO/RH(D): O POS

## 2018-08-13 NOTE — Progress Notes (Signed)
POSTPARTUM PROGRESS NOTE  Post Partum Day 1 Subjective:  Kari Campbell is a 23 y.o. G1P1001 [redacted]w[redacted]d s/p SVD.  No acute events overnight.  Pt denies problems with ambulating, voiding or po intake.  She denies nausea or vomiting.  Pain is well controlled.  She has had flatus. She has had bowel movement.  Lochia Small.   Objective: Blood pressure 108/68, pulse 85, temperature 97.7 F (36.5 C), temperature source Oral, resp. rate 17, height 5\' 7"  (1.702 m), weight 94.7 kg, SpO2 99 %, unknown if currently breastfeeding.  Physical Exam:  General: alert, cooperative and no distress Lochia:normal flow Chest: no respiratory distress Abdomen: soft, nontender Uterine Fundus: firm, appropriately tender DVT Evaluation: No calf swelling or tenderness Extremities: no edema  Recent Labs    08/11/18 2250 08/12/18 0748  HGB 11.9* 10.7*  HCT 37.0 34.1*    Assessment/Plan:  ASSESSMENT: Kari Campbell is a 23 y.o. G1P1001 [redacted]w[redacted]d s/p SVD  Plan for discharge tomorrow and Lactation consult   LOS: 2 days   Kari Campbell LockamyMD 08/13/2018, 8:50 AM

## 2018-08-14 MED ORDER — SENNOSIDES-DOCUSATE SODIUM 8.6-50 MG PO TABS: 2.0000 | ORAL_TABLET | ORAL | 0 refills | Status: DC

## 2018-08-14 MED ORDER — MEASLES, MUMPS & RUBELLA VAC IJ SOLR
0.5000 mL | Freq: Once | INTRAMUSCULAR | Status: AC
  Administered 2018-08-14: 0.5 mL via SUBCUTANEOUS
  Filled 2018-08-14: qty 0.5

## 2018-08-14 MED ORDER — IBUPROFEN 600 MG PO TABS: 600.0000 mg | ORAL_TABLET | Freq: Four times a day (QID) | ORAL | 0 refills | Status: DC

## 2018-08-14 NOTE — Lactation Note (Signed)
This note was copied from a baby's chart. Lactation Consultation Note  Patient Name: Boy Reeshemah Tetterton [id number] Date: 08/14/2018 Reason for consult: Follow-up assessment;Primapara;Term  Visited with P1 Mom of term baby at 72 hrs old.  Baby at 7% weight loss, with adequate output (3 stools, 2 voids last 24 hrs).  Baby is exclusively breastfeeding and Mom feels much better about latching baby.  Nipples sore on initial latch, but knows to wait for a wide gape of baby's mouth before bringing baby onto breast.    Mom has her personal Medela PIS at bedside.  She wanted to stimulate left breast as she wasn't seeing any colostrum with hand expression.  She hand expresses from right breast easier.    Encouraged keeping baby STS as much as possible Breastfeed baby with any feeding cues, goal of 8-12 feedings per 24 hrs.  Engorgement prevention and treatment reviewed.  Mom aware of OP lactation support available to her, and encouraged to call prn.   Consult Status Consult Status: Complete Date: 08/14/18 Follow-up type: Call as needed    Judee Clara 08/14/2018, 10:39 AM

## 2018-08-14 NOTE — Discharge Instructions (Signed)
Vaginal Delivery, Care After °Refer to this sheet in the next few weeks. These instructions provide you with information about caring for yourself after vaginal delivery. Your health care provider may also give you more specific instructions. Your treatment has been planned according to current medical practices, but problems sometimes occur. Call your health care provider if you have any problems or questions. °What can I expect after the procedure? °After vaginal delivery, it is common to have: °· Some bleeding from your vagina. °· Soreness in your abdomen, your vagina, and the area of skin between your vaginal opening and your anus (perineum). °· Pelvic cramps. °· Fatigue. °Follow these instructions at home: °Medicines °· Take over-the-counter and prescription medicines only as told by your health care provider. °· If you were prescribed an antibiotic medicine, take it as told by your health care provider. Do not stop taking the antibiotic until it is finished. °Driving ° °· Do not drive or operate heavy machinery while taking prescription pain medicine. °· Do not drive for 24 hours if you received a sedative. °Lifestyle °· Do not drink alcohol. This is especially important if you are breastfeeding or taking medicine to relieve pain. °· Do not use tobacco products, including cigarettes, chewing tobacco, or e-cigarettes. If you need help quitting, ask your health care provider. °Eating and drinking °· Drink at least 8 eight-ounce glasses of water every day unless you are told not to by your health care provider. If you choose to breastfeed your baby, you may need to drink more water than this. °· Eat high-fiber foods every day. These foods may help prevent or relieve constipation. High-fiber foods include: °? Whole grain cereals and breads. °? Brown rice. °? Beans. °? Fresh fruits and vegetables. °Activity °· Return to your normal activities as told by your health care provider. Ask your health care provider what  activities are safe for you. °· Rest as much as possible. Try to rest or take a nap when your baby is sleeping. °· Do not lift anything that is heavier than your baby or 10 lb (4.5 kg) until your health care provider says that it is safe. °· Talk with your health care provider about when you can engage in sexual activity. This may depend on your: °? Risk of infection. °? Rate of healing. °? Comfort and desire to engage in sexual activity. °Vaginal Care °· If you have an episiotomy or a vaginal tear, check the area every day for signs of infection. Check for: °? More redness, swelling, or pain. °? More fluid or blood. °? Warmth. °? Pus or a bad smell. °· Do not use tampons or douches until your health care provider says this is safe. °· Watch for any blood clots that may pass from your vagina. These may look like clumps of dark red, brown, or black discharge. °General instructions °· Keep your perineum clean and dry as told by your health care provider. °· Wear loose, comfortable clothing. °· Wipe from front to back when you use the toilet. °· Ask your health care provider if you can shower or take a bath. If you had an episiotomy or a perineal tear during labor and delivery, your health care provider may tell you not to take baths for a certain length of time. °· Wear a bra that supports your breasts and fits you well. °· If possible, have someone help you with household activities and help care for your baby for at least a few days after you   leave the hospital. °· Keep all follow-up visits for you and your baby as told by your health care provider. This is important. °Contact a health care provider if: °· You have: °? Vaginal discharge that has a bad smell. °? Difficulty urinating. °? Pain when urinating. °? A sudden increase or decrease in the frequency of your bowel movements. °? More redness, swelling, or pain around your episiotomy or vaginal tear. °? More fluid or blood coming from your episiotomy or vaginal  tear. °? Pus or a bad smell coming from your episiotomy or vaginal tear. °? A fever. °? A rash. °? Little or no interest in activities you used to enjoy. °? Questions about caring for yourself or your baby. °· Your episiotomy or vaginal tear feels warm to the touch. °· Your episiotomy or vaginal tear is separating or does not appear to be healing. °· Your breasts are painful, hard, or turn red. °· You feel unusually sad or worried. °· You feel nauseous or you vomit. °· You pass large blood clots from your vagina. If you pass a blood clot from your vagina, save it to show to your health care provider. Do not flush blood clots down the toilet without having your health care provider look at them. °· You urinate more than usual. °· You are dizzy or light-headed. °· You have not breastfed at all and you have not had a menstrual period for 12 weeks after delivery. °· You have stopped breastfeeding and you have not had a menstrual period for 12 weeks after you stopped breastfeeding. °Get help right away if: °· You have: °? Pain that does not go away or does not get better with medicine. °? Chest pain. °? Difficulty breathing. °? Blurred vision or spots in your vision. °? Thoughts about hurting yourself or your baby. °· You develop pain in your abdomen or in one of your legs. °· You develop a severe headache. °· You faint. °· You bleed from your vagina so much that you fill two sanitary pads in one hour. °This information is not intended to replace advice given to you by your health care provider. Make sure you discuss any questions you have with your health care provider. °Document Released: 06/02/2000 Document Revised: 11/17/2015 Document Reviewed: 06/20/2015 °Elsevier Interactive Patient Education © 2019 [employer] Inc. ° °

## 2019-01-14 ENCOUNTER — Telehealth: Payer: Self-pay

## 2019-01-14 NOTE — Telephone Encounter (Signed)
Attempted TC to patient [phone])- no answer. Aileen Fass, RN

## 2019-01-14 NOTE — Telephone Encounter (Signed)
Attempted TC to patient.  Ph # not in service.  Attempted TC to emerg. contact [facility] RN ...................................................................Aileen Fass, RN  Oct 22, 2018 3:23 PM   Attempted TC to patient. Ph# not in service. ...................................................................Aileen Fass, RN  Nov 06, 2018 1:07 PM   Letter mailed. See scanned ...................................................................Aileen Fass, RN  Nov 06, 2018 1:11 PM   Attempted TC to patient.  Ph not in service. ...................................................................Aileen Fass, RN  November 26, 2018 4:44 PM

## 2019-02-13 ENCOUNTER — Telehealth: Payer: Self-pay

## 2019-02-13 NOTE — Telephone Encounter (Signed)
Numerous attempts made to patient. Closed to pap f/u.Aileen Fass, RN

## 2019-04-17 ENCOUNTER — Other Ambulatory Visit (HOSPITAL_COMMUNITY): Payer: Self-pay | Admitting: Family

## 2019-04-17 ENCOUNTER — Encounter (HOSPITAL_COMMUNITY): Payer: Self-pay

## 2019-04-21 ENCOUNTER — Other Ambulatory Visit (HOSPITAL_COMMUNITY): Payer: Self-pay | Admitting: Family

## 2019-04-21 DIAGNOSIS — Z3A13 13 weeks gestation of pregnancy: Secondary | ICD-10-CM

## 2019-04-21 DIAGNOSIS — Z369 Encounter for antenatal screening, unspecified: Secondary | ICD-10-CM

## 2019-05-05 ENCOUNTER — Other Ambulatory Visit (HOSPITAL_COMMUNITY): Payer: Medicaid Other

## 2019-05-05 ENCOUNTER — Ambulatory Visit (HOSPITAL_COMMUNITY): Payer: Medicaid Other

## 2019-05-13 ENCOUNTER — Encounter (HOSPITAL_COMMUNITY): Payer: Self-pay | Admitting: *Deleted

## 2019-05-19 ENCOUNTER — Ambulatory Visit (HOSPITAL_COMMUNITY): Payer: Medicaid Other

## 2019-05-19 ENCOUNTER — Ambulatory Visit (HOSPITAL_COMMUNITY): Payer: Medicaid Other | Admitting: *Deleted

## 2019-05-19 ENCOUNTER — Ambulatory Visit (HOSPITAL_COMMUNITY)
Admission: RE | Admit: 2019-05-19 | Discharge: 2019-05-19 | Disposition: A | Discharge status: Home / Self Care | Payer: Medicaid Other | Source: Ambulatory Visit | Attending: Obstetrics and Gynecology | Admitting: Obstetrics and Gynecology

## 2019-05-19 ENCOUNTER — Encounter (HOSPITAL_COMMUNITY): Payer: Self-pay

## 2019-05-19 ENCOUNTER — Other Ambulatory Visit: Payer: Self-pay

## 2019-05-19 VITALS — BP 114/69 | HR 97 | Temp 98.2°F

## 2019-05-19 VITALS — Wt 200.0 lb

## 2019-05-19 DIAGNOSIS — Z3682 Encounter for antenatal screening for nuchal translucency: Secondary | ICD-10-CM | POA: Diagnosis not present

## 2019-05-19 DIAGNOSIS — Z3A13 13 weeks gestation of pregnancy: Secondary | ICD-10-CM | POA: Diagnosis not present

## 2019-05-19 DIAGNOSIS — O09891 Supervision of other high risk pregnancies, first trimester: Secondary | ICD-10-CM | POA: Insufficient documentation

## 2019-05-19 DIAGNOSIS — Z1379 Encounter for other screening for genetic and chromosomal anomalies: Secondary | ICD-10-CM

## 2019-05-19 DIAGNOSIS — Z369 Encounter for antenatal screening, unspecified: Secondary | ICD-10-CM

## 2019-05-19 HISTORY — DX: Unspecified abnormal cytological findings in specimens from vagina: R87.629

## 2019-05-19 HISTORY — DX: Unspecified asthma, uncomplicated: J45.909

## 2019-05-21 LAB — FIRST TRIMESTER SCREEN W/NT
CRL: 76.1 mm
DIA MoM: 1.04
DIA Value: 175.5 pg/mL
Gest Age-Collect: 13.4 weeks
Maternal Age At EDD: 23.6 yr
Nuchal Translucency MoM: 0.7
Nuchal Translucency: 1.5 mm
Number of Fetuses: 1
PAPP-A MoM: 0.57
PAPP-A Value: 519 ng/mL
Test Results:: NEGATIVE
Weight: 200 [lb_av]
hCG MoM: 1.28
hCG Value: 85.8 IU/mL

## 2019-05-22 ENCOUNTER — Telehealth (HOSPITAL_COMMUNITY): Payer: Self-pay | Admitting: Genetic Counselor

## 2019-05-22 NOTE — Telephone Encounter (Signed)
I called [name] to discuss her negative first trimester screen results. We reviewed that the risk for her pregnancy to be affected by Down syndrome decreased from her 1 in 989 age-related risk to less than 1 in 10,000, and the risk for trisomy 18 decreased from her 1 in 17 age-related risk to less than 1 in 10,000 based on the results of this screen. [name] was reminded that while this screen significantly reduces the likelihood of the pregnancy being affected by trisomy 4 or trisomy 61, it cannot be considered diagnostic. Diagnostic testing via [facility] or amniocentesis is available should she be interested in pursuing this. First trimester screening does not screen for open neural tube defects such as spina bifida, so it is recommended that her OBGYN provider should order MS-AFP screening around 16-18 weeks to screen for this. [name] confirmed that she had no questions about these results.  Buelah Manis, MS Genetic Counselor

## 2019-06-20 NOTE — L&D Delivery Note (Signed)
Delivery Note At 1222 a viable female infant was delivered via SVD, presentation: OA. APGAR: 9, 10; weight pending.   Placenta status: spontaneously delivered intact with gentle cord traction. Fundus firm with massage and Pitocin.   Anesthesia: none Lacerations: none Est. Blood Loss (mL): 100 Placenta to LD Complications none Cord ph n/a   Mom to postpartum. Baby to Couplet care / Skin to Skin.    Donette Larry, CNM 11/17/2019 12:57 PM

## 2019-09-05 ENCOUNTER — Inpatient Hospital Stay (HOSPITAL_BASED_OUTPATIENT_CLINIC_OR_DEPARTMENT_OTHER): Payer: BC Managed Care – PPO

## 2019-09-05 ENCOUNTER — Other Ambulatory Visit: Payer: Self-pay

## 2019-09-05 ENCOUNTER — Inpatient Hospital Stay (HOSPITAL_COMMUNITY)
Admission: AD | Admit: 2019-09-05 | Discharge: 2019-09-06 | Disposition: A | Discharge status: Home / Self Care | Payer: BC Managed Care – PPO | Attending: Obstetrics & Gynecology | Admitting: Obstetrics & Gynecology

## 2019-09-05 ENCOUNTER — Encounter (HOSPITAL_COMMUNITY): Payer: Self-pay | Admitting: Obstetrics & Gynecology

## 2019-09-05 DIAGNOSIS — R102 Pelvic and perineal pain: Secondary | ICD-10-CM | POA: Diagnosis not present

## 2019-09-05 DIAGNOSIS — O4690 Antepartum hemorrhage, unspecified, unspecified trimester: Secondary | ICD-10-CM

## 2019-09-05 DIAGNOSIS — N949 Unspecified condition associated with female genital organs and menstrual cycle: Secondary | ICD-10-CM

## 2019-09-05 DIAGNOSIS — Z3A28 28 weeks gestation of pregnancy: Secondary | ICD-10-CM | POA: Insufficient documentation

## 2019-09-05 DIAGNOSIS — Z3A29 29 weeks gestation of pregnancy: Secondary | ICD-10-CM

## 2019-09-05 DIAGNOSIS — Z6790 Unspecified blood type, Rh positive: Secondary | ICD-10-CM

## 2019-09-05 DIAGNOSIS — O4693 Antepartum hemorrhage, unspecified, third trimester: Secondary | ICD-10-CM | POA: Diagnosis not present

## 2019-09-05 DIAGNOSIS — O4703 False labor before 37 completed weeks of gestation, third trimester: Secondary | ICD-10-CM

## 2019-09-05 DIAGNOSIS — O26893 Other specified pregnancy related conditions, third trimester: Secondary | ICD-10-CM | POA: Insufficient documentation

## 2019-09-05 DIAGNOSIS — O26853 Spotting complicating pregnancy, third trimester: Secondary | ICD-10-CM

## 2019-09-05 DIAGNOSIS — Z3689 Encounter for other specified antenatal screening: Secondary | ICD-10-CM

## 2019-09-05 DIAGNOSIS — O4700 False labor before 37 completed weeks of gestation, unspecified trimester: Secondary | ICD-10-CM

## 2019-09-05 DIAGNOSIS — O479 False labor, unspecified: Secondary | ICD-10-CM

## 2019-09-05 LAB — URINALYSIS, ROUTINE W REFLEX MICROSCOPIC
Bilirubin Urine: NEGATIVE
Glucose, UA: NEGATIVE mg/dL
Hgb urine dipstick: NEGATIVE
Ketones, ur: 80 mg/dL — AB
Leukocytes,Ua: NEGATIVE
Nitrite: NEGATIVE
Protein, ur: NEGATIVE mg/dL
Specific Gravity, Urine: 1.021 (ref 1.005–1.030)
pH: 6 (ref 5.0–8.0)

## 2019-09-05 LAB — WET PREP, GENITAL
Clue Cells Wet Prep HPF POC: NONE SEEN
Sperm: NONE SEEN
Trich, Wet Prep: NONE SEEN
Yeast Wet Prep HPF POC: NONE SEEN

## 2019-09-05 MED ORDER — NIFEDIPINE 10 MG PO CAPS
10.0000 mg | ORAL_CAPSULE | ORAL | Status: DC | PRN
  Administered 2019-09-05: 10 mg via ORAL
  Filled 2019-09-05: qty 1

## 2019-09-05 MED ORDER — LACTATED RINGERS IV SOLN: Freq: Once | INTRAVENOUS | Status: AC

## 2019-09-05 NOTE — MAU Provider Note (Addendum)
History     CSN: [id number]  Arrival date and time: 09/05/19 1850   First Provider Initiated Contact with Patient 09/05/19 1946      Chief Complaint  Patient presents with  . Vaginal Bleeding  . Back Pain  . Abdominal Pain   HPI Kari Campbell 24 y.o. [redacted]w[redacted]d Comes to MAU as she saw light blood when wiping after urinating earlier today.  Was at work and then went shopping.  Began having contractions and is cramping - feeling pain in her upper thighs.  Has had 2 bottles of water to drink today.  Does not like to drink water. Patient has not had recent intercourse.  OB History    Gravida  2   Para  1   Term  1   Preterm      AB      Living  1     SAB      TAB      Ectopic      Multiple  0   Live Births  1           Past Medical History:  Diagnosis Date  . Asthma   . Vaginal Pap smear, abnormal     History reviewed. No pertinent surgical history.  Family History  Problem Relation Age of Onset  . Thyroid disease Mother   . Diabetes Father     Social History   Tobacco Use  . Smoking status: Never Smoker  . Smokeless tobacco: Never Used  Substance Use Topics  . Alcohol use: Never  . Drug use: Not Currently    Types: Marijuana    Comment: marijuana use "before pregnancy"    Allergies: No Known Allergies  Medications Prior to Admission  Medication Sig Dispense Refill Last Dose  . Prenatal Vit-Fe Fumarate-FA (PRENATAL MULTIVITAMIN) TABS tablet Take 1 tablet by mouth daily at 12 noon.   09/04/2019 at Unknown time  . ibuprofen (ADVIL,MOTRIN) 600 MG tablet Take 1 tablet (600 mg total) by mouth every 6 (six) hours. (Patient not taking: Reported on 05/19/2019) 30 tablet 0   . senna-docusate (SENOKOT-S) 8.6-50 MG tablet Take 2 tablets by mouth daily. (Patient not taking: Reported on 05/19/2019) 20 tablet 0     Review of Systems  Constitutional: Negative for fever.  Respiratory: Negative for cough and shortness of breath.    Gastrointestinal: Positive for abdominal pain. Negative for diarrhea, nausea and vomiting.  Genitourinary: Positive for vaginal bleeding. Negative for dysuria and vaginal discharge.   Physical Exam   Blood pressure (!) 114/58, pulse 89, temperature 98.2 F (36.8 C), temperature source Oral, resp. rate 16, height 5' 7.5" (1.715 m), weight 90.7 kg, last menstrual period 02/03/2019, SpO2 100 %, unknown if currently breastfeeding.  Patient Vitals for the past 24 hrs:  BP Temp Temp src Pulse Resp SpO2 Height Weight  09/06/19 0004 (!) 114/58 98.2 F (36.8 C) Oral 89 -- -- -- --  09/05/19 2211 111/63 -- -- -- -- -- -- --  09/05/19 2200 111/63 -- -- 84 -- -- -- --  09/05/19 2000 -- -- -- -- -- 100 % -- --  09/05/19 1955 -- -- -- -- -- 100 % -- --  09/05/19 1950 -- -- -- -- -- 100 % -- --  09/05/19 1945 -- -- -- -- -- 100 % -- --  09/05/19 1940 122/71 -- -- 87 -- 100 % 5' 7.5" (1.715 m) 90.7 kg  09/05/19 1937 121/74 98.2 F (36.8 C) Oral 93 16 -- -- --  09/05/19 1935 -- -- -- -- -- 100 % -- --  09/05/19 1930 -- -- -- -- -- 100 % -- --   Physical Exam  Nursing note and vitals reviewed. Constitutional: She is oriented to person, place, and time. She appears well-developed and well-nourished.  HENT:  Head: Normocephalic.  Eyes: EOM are normal.  GI: Soft. There is no abdominal tenderness. There is no rebound and no guarding.  FHT baseline 150 with moderate variability.  Contractions 3-6 minutes apart.  No decelerations.  10x10 accels noted.  Reassuring strip for gestational age.  Genitourinary:    Genitourinary Comments: Speculum exam: Vagina - Small amount of white discharge, no odor, no bleeding Cervix - No contact bleeding Bimanual exam: Cervix closed, and thick GC/Chlam, wet prep done Chaperone present for exam.    Musculoskeletal:        General: Normal range of motion.     Cervical back: Neck supple.  Neurological: She is alert and oriented to person, place, and time.  Skin:  Skin is warm and dry.  Psychiatric: She has a normal mood and affect.   Results for orders placed or performed during the hospital encounter of 09/05/19 (from the past 24 hour(s))  Wet prep, genital     Status: Abnormal   Collection Time: 09/05/19  7:58 PM   Specimen: PATH Cytology Cervicovaginal Ancillary Only  Result Value Ref Range   Yeast Wet Prep HPF POC NONE SEEN NONE SEEN   Trich, Wet Prep NONE SEEN NONE SEEN   Clue Cells Wet Prep HPF POC NONE SEEN NONE SEEN   WBC, Wet Prep HPF POC MANY (A) NONE SEEN   Sperm NONE SEEN   Urinalysis, Routine w reflex microscopic     Status: Abnormal   Collection Time: 09/05/19  8:15 PM  Result Value Ref Range   Color, Urine YELLOW YELLOW   APPearance HAZY (A) CLEAR   Specific Gravity, Urine 1.021 1.005 - 1.030   pH 6.0 5.0 - 8.0   Glucose, UA NEGATIVE NEGATIVE mg/dL   Hgb urine dipstick NEGATIVE NEGATIVE   Bilirubin Urine NEGATIVE NEGATIVE   Ketones, ur 80 (A) NEGATIVE mg/dL   Protein, ur NEGATIVE NEGATIVE mg/dL   Nitrite NEGATIVE NEGATIVE   Leukocytes,Ua NEGATIVE NEGATIVE   No results found.  MAU Course  Procedures  MDM No blood in vaginal.  Client not able to urinate.  Considering if blood she saw was from urine as there is no blood in vagina on exam. IV fluids did not stop contractions completely.  Client still having some contractions, not as frequently but feeling like cramping with the contractions.  Will give procardia - ordered.  Care assumed by N. Grae Leathers, NP at 2100  -fFN - not ordered prior to initial cervical exam, unable to be performed after transfer of care -CE by previous provider: 0/50 -UA: hazy/80ketones, otherwise WNL, urine culture ordered based on symptoms -WetPrep: WNL -GC/CT collected -RH positive -EFM (after fluids and Procardia): reactive       -baseline: 130       -variability: moderate       -accels: present, 15x15       -decels: absent       -TOCO: no ctx, pt reports not feeling ctx -repeat CE:  unchanged, cervix posterior, no blood noted on glove -Limited US: normal -consulted with Dr. Hulan Fray @1110PM , pt OK to be discharged home in absence of vaginal bleeding in MAU -pt discharged to home in stable condition  Orders Placed This Encounter  Procedures  . Wet prep, genital    Standing Status:   Standing    Number of Occurrences:   1  . Culture, OB Urine    Standing Status:   Standing    Number of Occurrences:   1  . [place] MFM OB LIMITED    Standing Status:   Standing    Number of Occurrences:   1    Order Specific Question:   Symptom/Reason for Exam    Answer:   Vaginal bleeding in pregnancy [[id number]]  . Urinalysis, Routine w reflex microscopic    Standing Status:   Standing    Number of Occurrences:   1  . Discharge patient    Order Specific Question:   Discharge disposition    Answer:   01-Home or Self Care [1]    Order Specific Question:   Discharge patient date    Answer:   09/05/2019   Meds ordered this encounter  Medications  . lactated ringers infusion  . NIFEdipine (PROCARDIA) capsule 10 mg   Assessment and Plan   1. Preterm contractions   2. Vaginal bleeding in pregnancy   3. [redacted] weeks gestation of pregnancy   4. NST (non-stress test) reactive   5. Round ligament pain   6. Blood type, Rh positive    Allergies as of 09/06/2019   No Known Allergies     Medication List    STOP taking these medications   ibuprofen 600 MG tablet Commonly known as: ADVIL     TAKE these medications   prenatal multivitamin Tabs tablet Take 1 tablet by mouth daily at 12 noon.   senna-docusate 8.6-50 MG tablet Commonly known as: Senokot-S Take 2 tablets by mouth daily.      -patient asked about round ligament pain, discussed s/sx, patient has pregnancy support belt at home -discussed s/sx of PTL -return MAU precautions given -considered Procardia at discharge, but BP below threshold for administration, Procardia not prescribed -pt discharged to home in stable  condition  Corban Kistler, Odie Sera, NP  12:07 AM 09/06/2019

## 2019-09-05 NOTE — Discharge Instructions (Signed)
Dolor abdominal durante el embarazo Abdominal Pain During Pregnancy  El dolor abdominal es comn durante el embarazo y tiene muchas causas posibles. Algunas causas son ms graves que otras, y a Curator causa se desconoce. El dolor abdominal puede ser un indicio de que est comenzando el Manito. Tambin puede ser ocasionado por el crecimiento y estiramiento de los msculos y ligamentos durante el Media planner. Siempre informe a su mdico si siente dolor abdominal. Siga estas indicaciones en su casa:  No tenga relaciones sexuales ni se coloque nada dentro de la vagina hasta que el dolor haya desaparecido completamente.  Descanse todo lo que pueda [employer] dolor se le haya calmado.  Beba suficiente lquido para Consulting civil engineer orina de color amarillo plido.  Tome los medicamentos de venta libre y los recetados solamente como se lo haya indicado el mdico.  Consulting civil engineer a todas las visitas de control como se lo haya indicado el mdico. Esto es importante. Comunquese con un mdico si:  El dolor contina o empeora despus de Production assistant, radio.  Siente dolor en la parte inferior del abdomen que: ? Va y viene en intervalos regulares. ? Se extiende a la espalda. ? Es parecido a los Stage manager.  Siente dolor o ardor al Garment/textile technologist. Solicite ayuda de inmediato si:  Tiene fiebre o siente escalofros.  Tiene una hemorragia vaginal abundante.  Tiene una prdida de lquido por la vagina.  Elimina tejidos por la vagina.  Vomita o tiene diarrea durante ms de 24horas.  El beb se mueve menos de lo habitual.  Se siente dbil o se desmaya.  Le falta el aire.  Siente dolor intenso en la parte superior del abdomen. Resumen  El dolor abdominal es comn durante el Willis y tiene muchas causas posibles.  Si siente dolor abdominal durante el embarazo, informe al mdico de inmediato.  Siga las indicaciones del mdico para el cuidado en el hogar y concurra a todas las visitas de control como se lo  hayan indicado. Esta informacin no tiene Marine scientist el consejo del mdico. Asegrese de hacerle al mdico cualquier pregunta que tenga. Document Revised: 11/28/2016 Document Reviewed: 11/28/2016 Elsevier Patient Education  Kari Campbell.  Preterm Labor and Birth Information  The normal length of a pregnancy is 39-41 weeks. Preterm labor is when labor starts before 37 completed weeks of pregnancy. What are the risk factors for preterm labor? Preterm labor is more likely to occur in women who:  Have certain infections during pregnancy such as a bladder infection, sexually transmitted infection, or infection inside the uterus (chorioamnionitis).  Have a shorter-than-normal cervix.  Have gone into preterm labor before.  Have had surgery on their cervix.  Are younger than age 81 or older than age 65.  Are African American.  Are pregnant with twins or multiple babies (multiple gestation).  Take street drugs or smoke while pregnant.  Do not gain enough weight while pregnant.  Became pregnant shortly after having been pregnant. What are the symptoms of preterm labor? Symptoms of preterm labor include:  Cramps similar to those that can happen during a menstrual period. The cramps may happen with diarrhea.  Pain in the abdomen or lower back.  Regular uterine contractions that may feel like tightening of the abdomen.  A feeling of increased pressure in the pelvis.  Increased watery or bloody mucus discharge from the vagina.  Water breaking (ruptured amniotic sac). Why is it important to recognize signs of preterm labor? It is important to recognize signs of  preterm labor because babies who are born prematurely may not be fully developed. This can put them at an increased risk for:  Long-term (chronic) heart and lung problems.  Difficulty immediately after birth with regulating body systems, including blood sugar, body temperature, heart rate, and breathing  rate.  Bleeding in the brain.  Cerebral palsy.  Learning difficulties.  Death. These risks are highest for babies who are born before 34 weeks of pregnancy. How is preterm labor treated? Treatment depends on the length of your pregnancy, your condition, and the health of your baby. It may involve:  Having a stitch (suture) placed in your cervix to prevent your cervix from opening too early (cerclage).  Taking or being given medicines, such as: ? Hormone medicines. These may be given early in pregnancy to help support the pregnancy. ? Medicine to stop contractions. ? Medicines to help mature the baby's lungs. These may be prescribed if the risk of delivery is high. ? Medicines to prevent your baby from developing cerebral palsy. If the labor happens before 34 weeks of pregnancy, you may need to stay in the hospital. What should I do if I think I am in preterm labor? If you think that you are going into preterm labor, call your health care provider right away. How can I prevent preterm labor in future pregnancies? To increase your chance of having a full-term pregnancy:  Do not use any tobacco products, such as cigarettes, chewing tobacco, and e-cigarettes. If you need help quitting, ask your health care provider.  Do not use street drugs or medicines that have not been prescribed to you during your pregnancy.  Talk with your health care provider before taking any herbal supplements, even if you have been taking them regularly.  Make sure you gain a healthy amount of weight during your pregnancy.  Watch for infection. If you think that you might have an infection, get it checked right away.  Make sure to tell your health care provider if you have gone into preterm labor before. This information is not intended to replace advice given to you by your health care provider. Make sure you discuss any questions you have with your health care provider. Document Revised: 09/27/2018  Document Reviewed: 10/27/2015 Elsevier Patient Education  2020 Elsevier Inc.  Vaginal Bleeding During Pregnancy, Third Trimester  A small amount of bleeding from the vagina (spotting) is relatively common during pregnancy. Various things can cause bleeding or spotting during pregnancy. Sometimes bleeding is normal and is not a problem. However, bleeding during the third trimester can also be a sign of something serious for the mother and the baby. Be sure to tell your health care provider about any vaginal bleeding right away. Some possible causes of vaginal bleeding during the third trimester include:  Infection or growths (polyps) on the cervix.  A condition in which the placenta partially or completely covers the opening of the cervix inside the uterus (placenta previa).  The placenta separating from the uterus (placenta abruption).  The start of labor (discharging of the mucus plug).  A condition in which the placenta grows into the muscle layer of the uterus (placenta accreta). Follow these instructions at home: Activity  Follow instructions from your health care provider about limiting your activity. If your health care provider recommends activity restriction, you may need to stay in bed and only get up to use the bathroom. In some cases, your health care provider may allow you to continue light activity.  If needed,  make plans for someone to help with your regular activities.  Ask your health care provider if it is safe for you to drive.  Do not lift anything that is heavier than 10 lb (4.5 kg), or the limit that your health care provider tells you, until he or she says that it is safe.  Do not have sex or orgasms until your health care provider says that this is safe. Medicines  Take over-the-counter and prescription medicines only as told by your health care provider.  Do not take aspirin because it can cause bleeding. General instructions  Pay attention to any changes  in your symptoms.  Write down how many pads you use each day, how often you change pads, and how soaked (saturated) they are.  Do not use tampons or douche.  If you pass any tissue from your vagina, save the tissue so you can show it to your health care provider.  Keep all follow-up visits as told by your health care provider. This is important. Contact a health care provider if:  You have vaginal bleeding during any part of your pregnancy.  You have cramps or labor pains.  You have a fever. Get help right away if:  You have severe cramps or pain in your back or abdomen.  You have a gush of fluid from the vagina.  You pass large clots or a large amount of tissue from your vagina.  Your bleeding increases.  You feel light-headed or weak.  You faint.  You feel that your baby is moving less than usual, or not moving at all. Summary  Various things can cause bleeding or spotting in pregnancy.  Bleeding during the third trimester can be a sign of a serious problem for the mother and the baby.  Be sure to tell your health care provider about any vaginal bleeding right away. This information is not intended to replace advice given to you by your health care provider. Make sure you discuss any questions you have with your health care provider. Document Revised: 09/24/2018 Document Reviewed: 09/07/2016 Elsevier Patient Education  2020 Elsevier Inc.                   Safe Medications in Pregnancy    Acne: Benzoyl Peroxide Salicylic Acid  Backache/Headache: Tylenol: 2 regular strength every 4 hours OR              2 Extra strength every 6 hours  Colds/Coughs/Allergies: Benadryl (alcohol free) 25 mg every 6 hours as needed Breath right strips Claritin Cepacol throat lozenges Chloraseptic throat spray Cold-Eeze- up to three times per day Cough drops, alcohol free Flonase (by prescription only) Guaifenesin Mucinex Robitussin DM (plain only, alcohol free) Saline  nasal spray/drops Sudafed (pseudoephedrine) & Actifed ** use only after [redacted] weeks gestation and if you do not have high blood pressure Tylenol Vicks Vaporub Zinc lozenges Zyrtec   Constipation: Colace Ducolax suppositories Fleet enema Glycerin suppositories Metamucil Milk of magnesia Miralax Senokot Smooth move tea  Diarrhea: Kaopectate Imodium A-D  *NO pepto Bismol  Hemorrhoids: Anusol Anusol HC Preparation H Tucks  Indigestion: Tums Maalox Mylanta Zantac  Pepcid  Insomnia: Benadryl (alcohol free) 25mg  every 6 hours as needed Tylenol PM Unisom, no Gelcaps  Leg Cramps: Tums MagGel  Nausea/Vomiting:  Bonine Dramamine Emetrol Ginger extract Sea bands Meclizine  Nausea medication to take during pregnancy:  Unisom (doxylamine succinate 25 mg tablets) Take one tablet daily at bedtime. If symptoms are not adequately controlled, the dose  can be increased to a maximum recommended dose of two tablets daily (1/2 tablet in the morning, 1/2 tablet mid-afternoon and one at bedtime). Vitamin B6 100mg  tablets. Take one tablet twice a day (up to 200 mg per day).  Skin Rashes: Aveeno products Benadryl cream or 25mg  every 6 hours as needed Calamine Lotion 1% cortisone cream  Yeast infection: Gyne-lotrimin 7 Monistat 7   **If taking multiple medications, please check labels to avoid duplicating the same active ingredients **take medication as directed on the label ** Do not exceed 4000 mg of tylenol in 24 hours **Do not take medications that contain aspirin or ibuprofen

## 2019-09-05 NOTE — MAU Note (Signed)
Vaginal bleeding "just spotting like when period about to come on" Pinkish red in color.  Started at 6 pm.  Low abd pain and low back pain, all day, after walking around the store, then went to work and it didn't get better.   Baby only moved twice today.  No leaking.

## 2019-09-07 LAB — CULTURE, OB URINE: Culture: 30000 — AB

## 2019-09-08 LAB — GC/CHLAMYDIA PROBE AMP (~~LOC~~) NOT AT ARMC
Chlamydia: NEGATIVE
Comment: NEGATIVE
Comment: NORMAL
Neisseria Gonorrhea: NEGATIVE

## 2019-11-17 ENCOUNTER — Encounter (HOSPITAL_COMMUNITY): Payer: Self-pay | Admitting: Obstetrics and Gynecology

## 2019-11-17 ENCOUNTER — Inpatient Hospital Stay (HOSPITAL_COMMUNITY)
Admission: AD | Admit: 2019-11-17 | Discharge: 2019-11-19 | DRG: 807 | Disposition: A | Discharge status: Home / Self Care | Payer: BC Managed Care – PPO | Attending: Obstetrics and Gynecology | Admitting: Obstetrics and Gynecology

## 2019-11-17 ENCOUNTER — Other Ambulatory Visit: Payer: Self-pay

## 2019-11-17 DIAGNOSIS — D509 Iron deficiency anemia, unspecified: Secondary | ICD-10-CM | POA: Diagnosis present

## 2019-11-17 DIAGNOSIS — Z3A39 39 weeks gestation of pregnancy: Secondary | ICD-10-CM

## 2019-11-17 DIAGNOSIS — E669 Obesity, unspecified: Secondary | ICD-10-CM | POA: Diagnosis present

## 2019-11-17 DIAGNOSIS — O99214 Obesity complicating childbirth: Secondary | ICD-10-CM | POA: Diagnosis present

## 2019-11-17 DIAGNOSIS — F1290 Cannabis use, unspecified, uncomplicated: Secondary | ICD-10-CM | POA: Diagnosis present

## 2019-11-17 DIAGNOSIS — Z20822 Contact with and (suspected) exposure to covid-19: Secondary | ICD-10-CM | POA: Diagnosis present

## 2019-11-17 DIAGNOSIS — Z3490 Encounter for supervision of normal pregnancy, unspecified, unspecified trimester: Secondary | ICD-10-CM

## 2019-11-17 DIAGNOSIS — O9902 Anemia complicating childbirth: Secondary | ICD-10-CM | POA: Diagnosis present

## 2019-11-17 DIAGNOSIS — O99324 Drug use complicating childbirth: Secondary | ICD-10-CM | POA: Diagnosis present

## 2019-11-17 DIAGNOSIS — O26893 Other specified pregnancy related conditions, third trimester: Secondary | ICD-10-CM | POA: Diagnosis present

## 2019-11-17 HISTORY — DX: Encounter for supervision of normal pregnancy, unspecified, unspecified trimester: Z34.90

## 2019-11-17 LAB — TYPE AND SCREEN
ABO/RH(D): O POS
Antibody Screen: NEGATIVE

## 2019-11-17 LAB — CBC
HCT: 36.8 % (ref 36.0–46.0)
Hemoglobin: 12 g/dL (ref 12.0–15.0)
MCH: 27.6 pg (ref 26.0–34.0)
MCHC: 32.6 g/dL (ref 30.0–36.0)
MCV: 84.8 fL (ref 80.0–100.0)
Platelets: 136 10*3/uL — ABNORMAL LOW (ref 150–400)
RBC: 4.34 MIL/uL (ref 3.87–5.11)
RDW: 14.6 % (ref 11.5–15.5)
WBC: 11.2 10*3/uL — ABNORMAL HIGH (ref 4.0–10.5)
nRBC: 0 % (ref 0.0–0.2)

## 2019-11-17 LAB — OB RESULTS CONSOLE GC/CHLAMYDIA
Chlamydia: NEGATIVE
Gonorrhea: NEGATIVE

## 2019-11-17 LAB — OB RESULTS CONSOLE RPR: RPR: NONREACTIVE

## 2019-11-17 LAB — SARS CORONAVIRUS 2 BY RT PCR (HOSPITAL ORDER, PERFORMED IN ~~LOC~~ HOSPITAL LAB): SARS Coronavirus 2: NEGATIVE

## 2019-11-17 LAB — OB RESULTS CONSOLE HIV ANTIBODY (ROUTINE TESTING): HIV: NONREACTIVE

## 2019-11-17 LAB — OB RESULTS CONSOLE ANTIBODY SCREEN: Antibody Screen: NEGATIVE

## 2019-11-17 LAB — OB RESULTS CONSOLE HEPATITIS B SURFACE ANTIGEN: Hepatitis B Surface Ag: NEGATIVE

## 2019-11-17 LAB — OB RESULTS CONSOLE ABO/RH: RH Type: POSITIVE

## 2019-11-17 LAB — OB RESULTS CONSOLE RUBELLA ANTIBODY, IGM: Rubella: IMMUNE

## 2019-11-17 LAB — OB RESULTS CONSOLE GBS: GBS: NEGATIVE

## 2019-11-17 MED ORDER — PRENATAL MULTIVITAMIN CH
1.0000 | ORAL_TABLET | Freq: Every day | ORAL | Status: DC
  Administered 2019-11-18: 1 via ORAL
  Filled 2019-11-17: qty 1

## 2019-11-17 MED ORDER — ONDANSETRON HCL 4 MG/2ML IJ SOLN: 4.0000 mg | Freq: Four times a day (QID) | INTRAMUSCULAR | Status: DC | PRN

## 2019-11-17 MED ORDER — OXYTOCIN BOLUS FROM INFUSION: 500.0000 mL | Freq: Once | INTRAVENOUS | Status: DC

## 2019-11-17 MED ORDER — COCONUT OIL OIL: 1.0000 "application " | TOPICAL_OIL | Status: DC | PRN

## 2019-11-17 MED ORDER — DIPHENHYDRAMINE HCL 25 MG PO CAPS: 25.0000 mg | ORAL_CAPSULE | Freq: Four times a day (QID) | ORAL | Status: DC | PRN

## 2019-11-17 MED ORDER — OXYTOCIN 40 UNITS IN NORMAL SALINE INFUSION - SIMPLE MED: 2.5000 [IU]/h | INTRAVENOUS | Status: DC

## 2019-11-17 MED ORDER — WITCH HAZEL-GLYCERIN EX PADS: 1.0000 "application " | MEDICATED_PAD | CUTANEOUS | Status: DC | PRN

## 2019-11-17 MED ORDER — BENZOCAINE-MENTHOL 20-0.5 % EX AERO: 1.0000 "application " | INHALATION_SPRAY | CUTANEOUS | Status: DC | PRN

## 2019-11-17 MED ORDER — ACETAMINOPHEN 325 MG PO TABS: 650.0000 mg | ORAL_TABLET | ORAL | Status: DC | PRN

## 2019-11-17 MED ORDER — TETANUS-DIPHTH-ACELL PERTUSSIS 5-2.5-18.5 LF-MCG/0.5 IM SUSP: 0.5000 mL | Freq: Once | INTRAMUSCULAR | Status: DC

## 2019-11-17 MED ORDER — LACTATED RINGERS IV SOLN: 500.0000 mL | INTRAVENOUS | Status: DC | PRN

## 2019-11-17 MED ORDER — OXYTOCIN 10 UNIT/ML IJ SOLN
10.0000 [IU] | Freq: Once | INTRAMUSCULAR | Status: AC
  Administered 2019-11-17: 10 [IU] via INTRAMUSCULAR

## 2019-11-17 MED ORDER — ONDANSETRON HCL 4 MG/2ML IJ SOLN: 4.0000 mg | INTRAMUSCULAR | Status: DC | PRN

## 2019-11-17 MED ORDER — SOD CITRATE-CITRIC ACID 500-334 MG/5ML PO SOLN: 30.0000 mL | ORAL | Status: DC | PRN

## 2019-11-17 MED ORDER — SIMETHICONE 80 MG PO CHEW: 80.0000 mg | CHEWABLE_TABLET | ORAL | Status: DC | PRN

## 2019-11-17 MED ORDER — MEASLES, MUMPS & RUBELLA VAC IJ SOLR: 0.5000 mL | Freq: Once | INTRAMUSCULAR | Status: DC

## 2019-11-17 MED ORDER — OXYCODONE-ACETAMINOPHEN 5-325 MG PO TABS: 1.0000 | ORAL_TABLET | ORAL | Status: DC | PRN

## 2019-11-17 MED ORDER — DIBUCAINE (PERIANAL) 1 % EX OINT: 1.0000 "application " | TOPICAL_OINTMENT | CUTANEOUS | Status: DC | PRN

## 2019-11-17 MED ORDER — FLEET ENEMA 7-19 GM/118ML RE ENEM: 1.0000 | ENEMA | Freq: Every day | RECTAL | Status: DC | PRN

## 2019-11-17 MED ORDER — OXYTOCIN 10 UNIT/ML IJ SOLN
INTRAMUSCULAR | Status: AC
  Filled 2019-11-17: qty 1

## 2019-11-17 MED ORDER — LACTATED RINGERS IV SOLN: INTRAVENOUS | Status: DC

## 2019-11-17 MED ORDER — OXYCODONE-ACETAMINOPHEN 5-325 MG PO TABS: 2.0000 | ORAL_TABLET | ORAL | Status: DC | PRN

## 2019-11-17 MED ORDER — SENNOSIDES-DOCUSATE SODIUM 8.6-50 MG PO TABS
2.0000 | ORAL_TABLET | ORAL | Status: DC
  Administered 2019-11-17: 2 via ORAL
  Filled 2019-11-17: qty 2

## 2019-11-17 MED ORDER — LIDOCAINE HCL (PF) 1 % IJ SOLN: 30.0000 mL | INTRAMUSCULAR | Status: DC | PRN

## 2019-11-17 MED ORDER — ONDANSETRON HCL 4 MG PO TABS: 4.0000 mg | ORAL_TABLET | ORAL | Status: DC | PRN

## 2019-11-17 MED ORDER — IBUPROFEN 600 MG PO TABS
600.0000 mg | ORAL_TABLET | Freq: Four times a day (QID) | ORAL | Status: DC
  Administered 2019-11-17 – 2019-11-19 (×7): 600 mg via ORAL
  Filled 2019-11-17 (×7): qty 1

## 2019-11-17 NOTE — MAU Note (Signed)
Started having contractions yesterday, now every , sometimes closer. No leaking.  Lost her plug, blood mixed in. Was 1 cm last Thu. No problems with preg.

## 2019-11-17 NOTE — Discharge Summary (Signed)
Postpartum Discharge Summary    Patient Name: Kari Campbell DOB: June 07, 1996 MRN: [id number]  Date of admission: 11/17/2019 Delivery date:11/17/2019  Delivering provider: Julianne Handler  Date of discharge: 11/19/2019  Admitting diagnosis: Pregnancy [Z34.90] Intrauterine pregnancy: [redacted]w[redacted]d    Secondary diagnosis:  Active Problems:   Pregnancy   SVD (spontaneous vaginal delivery)  Additional problems: anemia, cannabis use, obesity    Discharge diagnosis: Term Pregnancy Delivered                                              Post partum procedures:Depo Augmentation: N/A Complications: None  Hospital course: Onset of Labor With Vaginal Delivery      24y.o. yo G2P2002 at 352w2das admitted in Active Labor on 11/17/2019. Patient had an uncomplicated labor course as follows:  Membrane Rupture Time/Date: 12:10 PM ,11/17/2019   Delivery Method:Vaginal, Spontaneous  Episiotomy: None  Lacerations:  None  Patient had an uncomplicated postpartum course.  She is ambulating, tolerating a regular diet, passing flatus, and urinating well. Patient is discharged home in stable condition on 11/19/19.  Newborn Data: Birth date:11/17/2019  Birth time:12:22 PM  Gender:Female  Living status:Living  Apgars:9 ,10  Weight:3265 g   Magnesium Sulfate received: No BMZ received: No Rhophylac:N/A MMR:N/A Transfusion:No  Physical exam  Vitals:   11/18/19 0500 11/18/19 1424 11/18/19 1940 11/19/19 0521  BP: 108/76 109/64 111/67 100/65  Pulse: 76 64 71 74  Resp: 16 17 16 16   Temp: 98.1 F (36.7 C) 98.1 F (36.7 C) 98.4 F (36.9 C) 98 F (36.7 C)  TempSrc: Oral Oral Axillary Oral  SpO2: 100%  99% 99%  Weight:      Height:       General: alert, cooperative and no distress Lochia: appropriate Uterine Fundus: firm Incision: N/A DVT Evaluation: No evidence of DVT seen on physical exam. Mild calf/ankle edema Labs: Lab Results  Component Value Date   WBC 11.2 (H) 11/17/2019   HGB 12.0  11/17/2019   HCT 36.8 11/17/2019   MCV 84.8 11/17/2019   PLT 136 (L) 11/17/2019   CMP Latest Ref Rng & Units 10/24/2013  Glucose 65 - 99 mg/dL 86  BUN 9 - 21 mg/dL 13  Creatinine 0.60 - 1.30 mg/dL 0.68  Sodium 132 - 141 mmol/L 137  Potassium 3.3 - 4.7 mmol/L 4.3  Chloride 97 - 107 mmol/L 108(H)  CO2 16 - 25 mmol/L 25  Calcium 9.0 - 10.7 mg/dL 8.2(L)  Total Protein 6.4 - 8.6 g/dL 7.1  Total Bilirubin 0.2 - 1.0 mg/dL 0.4  Alkaline Phos Unit/L 75  AST 0 - 26 Unit/L 26  ALT 12 - 78 U/L 31   Edinburgh Score: Edinburgh Postnatal Depression Scale Screening Tool 11/18/2019  I have been able to laugh and see the funny side of things. 0  I have looked forward with enjoyment to things. 0  I have blamed myself unnecessarily when things went wrong. 0  I have been anxious or worried for no good reason. 0  I have felt scared or panicky for no good reason. 0  Things have been getting on top of me. 1  I have been so unhappy that I have had difficulty sleeping. 0  I have felt sad or miserable. 0  I have been so unhappy that I have been crying. 0  The thought of harming myself has occurred  to me. 0  Edinburgh Postnatal Depression Scale Total 1     After visit meds:  Allergies as of 11/19/2019   No Known Allergies     Medication List    TAKE these medications   acetaminophen 325 MG tablet Commonly known as: Tylenol Take 2 tablets (650 mg total) by mouth every 4 (four) hours as needed (for pain scale < 4).   ibuprofen 600 MG tablet Commonly known as: ADVIL Take 1 tablet (600 mg total) by mouth every 6 (six) hours.   polyethylene glycol powder 17 GM/SCOOP powder Commonly known as: GLYCOLAX/MIRALAX Take 17 g by mouth daily as needed.   prenatal multivitamin Tabs tablet Take 1 tablet by mouth daily at 12 noon.   senna-docusate 8.6-50 MG tablet Commonly known as: Senokot-S Take 2 tablets by mouth daily.        Discharge home in stable condition Infant Feeding: Bottle Infant  Disposition:home with mother Discharge instruction: per After Visit Summary and Postpartum booklet. Activity: Advance as tolerated. Pelvic rest for 6 weeks.  Diet: routine diet Future Appointments:No future appointments. Follow up Visit: [facility] in 6 weeks  11/19/2019 Clarnce Flock, MD

## 2019-11-17 NOTE — H&P (Signed)
OBSTETRIC ADMISSION HISTORY AND PHYSICAL  Kari Campbell is a 24 y.o. female [phone] with IUP at [redacted]w[redacted]d presenting for labor. She reports +FMs. No LOF, VB, blurry vision, headaches, peripheral edema, or RUQ pain. She plans on bottlefeeding. She requests Depo Provera for birth control.  Dating: By Eber Jones [place] --->  Estimated Date of Delivery: 11/21/19  Sono:    @[redacted]w[redacted]d , normal anatomy, 364g, 31%ile, spine not well seen   Prenatal History/Complications: -anemia -cannabis use -ASCUS on pap -obesity  Past Medical History: Past Medical History:  Diagnosis Date  . Asthma   . Vaginal Pap smear, abnormal     Past Surgical History: History reviewed. No pertinent surgical history.  Obstetrical History: OB History    Gravida  2   Para  2   Term  2   Preterm      AB      Living  2     SAB      TAB      Ectopic      Multiple  0   Live Births  2           Social History: Social History   Socioeconomic History  . Marital status: Single    Spouse name: Not on file  . Number of children: Not on file  . Years of education: Not on file  . Highest education level: Not on file  Occupational History  . Not on file  Tobacco Use  . Smoking status: Never Smoker  . Smokeless tobacco: Never Used  Substance and Sexual Activity  . Alcohol use: Never  . Drug use: Not Currently    Types: Marijuana    Comment: marijuana use "before pregnancy"  . Sexual activity: Yes  Other Topics Concern  . Not on file  Social History Narrative  . Not on file   Social Determinants of Health   Financial Resource Strain:   . Difficulty of Paying Living Expenses:   Food Insecurity:   . Worried About Charity fundraiser in the Last Year:   . Arboriculturist in the Last Year:   Transportation Needs:   . Film/video editor (Medical):   Marland Kitchen Lack of Transportation (Non-Medical):   Physical Activity:   . Days of Exercise per Week:   . Minutes of Exercise per Session:   Stress:    . Feeling of Stress :   Social Connections:   . Frequency of Communication with Friends and Family:   . Frequency of Social Gatherings with Friends and Family:   . Attends Religious Services:   . Active Member of Clubs or Organizations:   . Attends Archivist Meetings:   Marland Kitchen Marital Status:     Family History: Family History  Problem Relation Age of Onset  . Thyroid disease Mother   . Diabetes Father     Allergies: No Known Allergies  Medications Prior to Admission  Medication Sig Dispense Refill Last Dose  . Prenatal Vit-Fe Fumarate-FA (PRENATAL MULTIVITAMIN) TABS tablet Take 1 tablet by mouth daily at 12 noon.   11/16/2019 at Unknown time  . senna-docusate (SENOKOT-S) 8.6-50 MG tablet Take 2 tablets by mouth daily. (Patient not taking: Reported on 05/19/2019) 20 tablet 0      Review of Systems:  All systems reviewed and negative except as stated in HPI  PE: Blood pressure 109/68, pulse 98, temperature 98.6 F (37 C), temperature source Oral, resp. rate 18, height 5\' 5"  (1.651 m), weight 93 kg,  last menstrual period 02/03/2019, SpO2 100 %, unknown if currently breastfeeding. General appearance: alert, cooperative and moderate distress Lungs: regular rate and effort Heart: regular rate  Abdomen: soft, non-tender Extremities: Homans sign is negative, no sign of DVT Presentation: cephalic EFM: 120 bpm, mod variability, + accels, variable decels Toco: q2 Dilation: 10 Effacement (%): 100 Station: -1 Exam by:: Leafy Ro, RN  Prenatal labs: ABO, Rh:  O pos Antibody:  neg Rubella:  Immune RPR:  NR HBsAg:  neg HIV:  NR  GBS:  neg 1 hr GTT 61  Prenatal Transfer Tool  Maternal Diabetes: No Genetic Screening: Normal Maternal Ultrasounds/Referrals: Normal Fetal Ultrasounds or other Referrals:  None Maternal Substance Abuse:  No Significant Maternal Medications:  None Significant Maternal Lab Results: Group B Strep negative  No results found for this  or any previous visit (from the past 24 hour(s)).  Patient Active Problem List   Diagnosis Date Noted  . Pregnancy 11/17/2019  . Normal labor and delivery 08/11/2018    Assessment: Kari Campbell is a 25 y.o. G2P2002 at [redacted]w[redacted]d here for active labor  1. Labor: second stage 2. FWB: Cat II 3. Pain: support w/o meds 4. GBS: neg   Plan: Admit to LD Anticipate precip delivery  Donette Larry, CNM  11/17/2019, 12:55 PM

## 2019-11-18 DIAGNOSIS — Z3A39 39 weeks gestation of pregnancy: Secondary | ICD-10-CM

## 2019-11-18 LAB — RPR: RPR Ser Ql: NONREACTIVE

## 2019-11-18 NOTE — Progress Notes (Addendum)
Post Partum Day 1  Subjective:  Kari Campbell is a 24 y.o. [phone] [redacted]w[redacted]d s/p SVD.  No acute events overnight.  Pt denies problems with ambulating, voiding or po intake.  She denies nausea or vomiting.  Pain is well controlled.  She has had flatus. She has not had bowel movement.  Lochia Small.  Plan for birth control is Depo-Provera.  Method of Feeding: bottle  Objective: BP 108/76 (BP Location: Left Arm)    Pulse 76    Temp 98.1 F (36.7 C) (Oral)    Resp 16    Ht 5\' 5"  (1.651 m)    Wt 93 kg    LMP 02/03/2019    SpO2 100%    Breastfeeding Unknown    BMI 34.12 kg/m   Physical Exam:  General: alert, cooperative and no distress Lochia:normal flow Chest: CTAB Heart: RRR no m/r/g Abdomen: +BS, soft, nontender, fundus firm at/below umbilicus Uterine Fundus: firm, above umbilicus  DVT Evaluation: No evidence of DVT seen on physical exam. Extremities: no LE edema  Recent Labs    11/17/19 1325  HGB 12.0  HCT 36.8    Assessment/Plan:  ASSESSMENT: Kari Campbell is a 24 y.o. G2P2002 [redacted]w[redacted]d ppd #1 s/p NSVD doing well.   Plan for discharge tomorrow and Contraception Depo    LOS: 1 day   [redacted]w[redacted]d 11/18/2019, 8:32 AM   GME ATTESTATION:  I saw and evaluated the patient. I agree with the findings and the plan of care as documented in the residents note with addition of the following:  Patient requesting abdominal binder- order placed  01/18/2020, DO OB Fellow, Faculty Practice [facility] 11/18/2019 9:39 AM

## 2019-11-19 MED ORDER — ACETAMINOPHEN 325 MG PO TABS: 650.0000 mg | ORAL_TABLET | ORAL | 0 refills | Status: DC | PRN

## 2019-11-19 MED ORDER — POLYETHYLENE GLYCOL 3350 17 GM/SCOOP PO POWD
17.0000 g | Freq: Every day | ORAL | 1 refills | Status: DC | PRN
Start: 2019-11-19 — End: 2020-06-15

## 2019-11-19 MED ORDER — MEDROXYPROGESTERONE ACETATE 150 MG/ML IM SUSP
150.0000 mg | Freq: Once | INTRAMUSCULAR | Status: AC
  Administered 2019-11-19: 150 mg via INTRAMUSCULAR
  Filled 2019-11-19: qty 1

## 2019-11-19 MED ORDER — IBUPROFEN 600 MG PO TABS: 600.0000 mg | ORAL_TABLET | Freq: Four times a day (QID) | ORAL | 0 refills | Status: DC

## 2019-11-19 NOTE — Discharge Instructions (Signed)

## 2020-02-04 IMAGING — US US MFM OB LIMITED
1 series · 15 of 28 positions shown · non-contrast
Comparison: none

[Series 1: us mfm ob limited · 45 acquisitions, 15 frames shown]
[im 1/45]
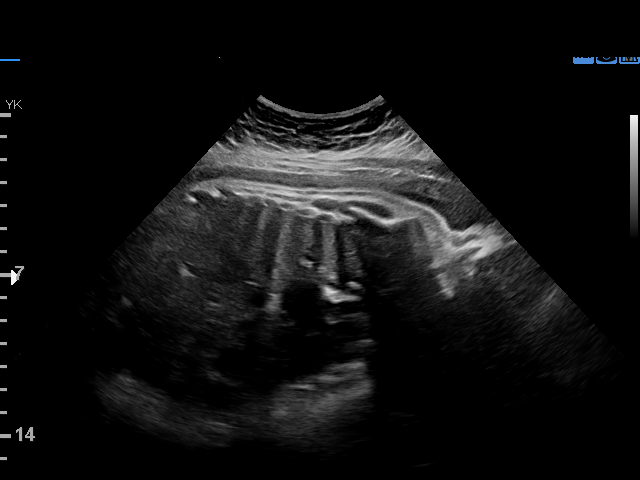
[im 4/45]
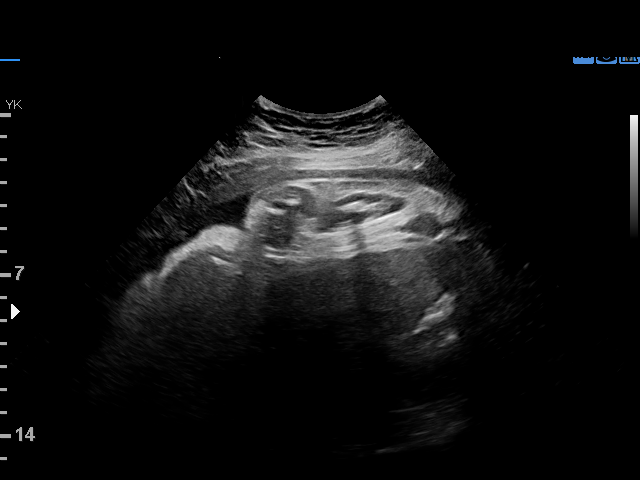
[im 7/45]
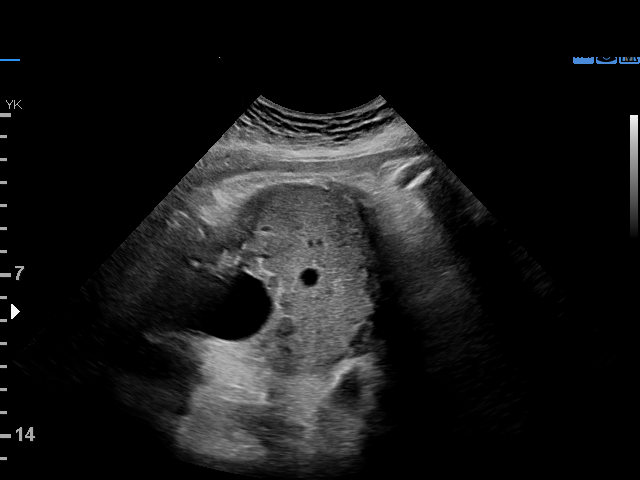
[im 10/45]
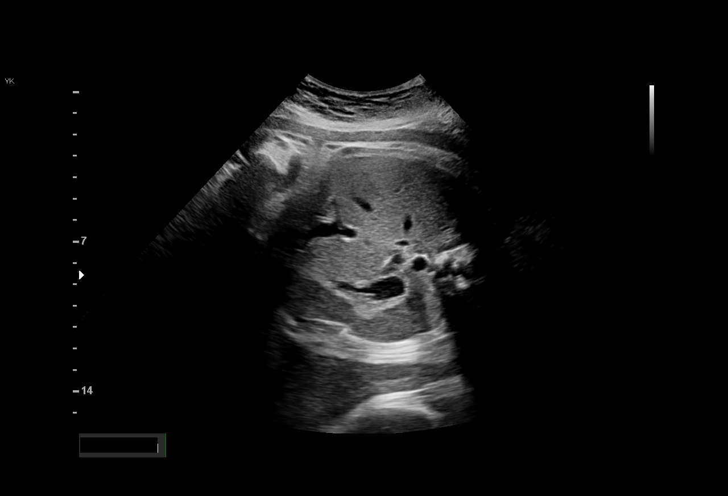
[im 14/45]
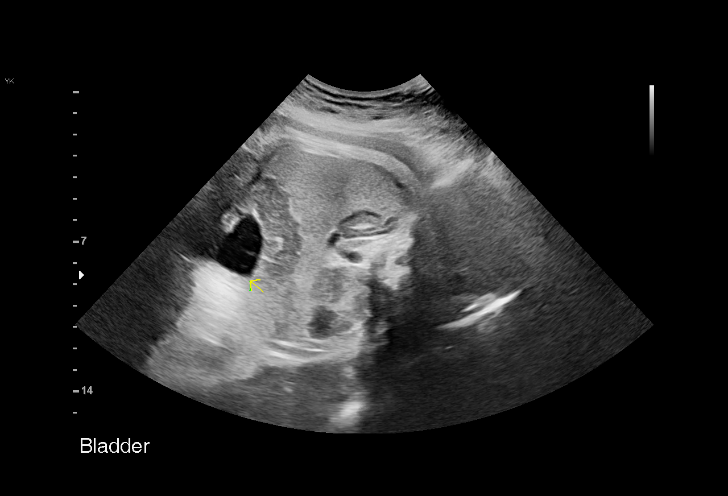
[im 17/45]
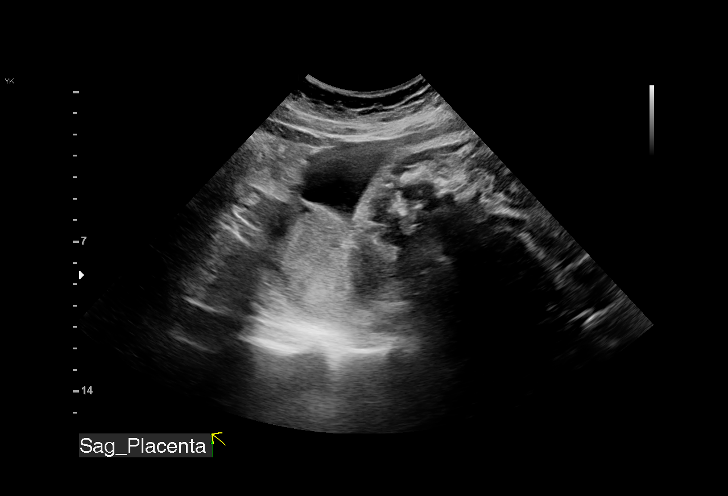
[im 20/45]
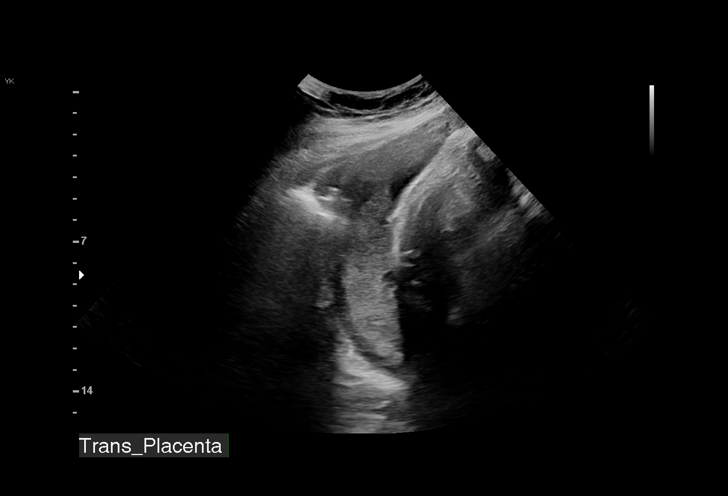
[im 23/45]
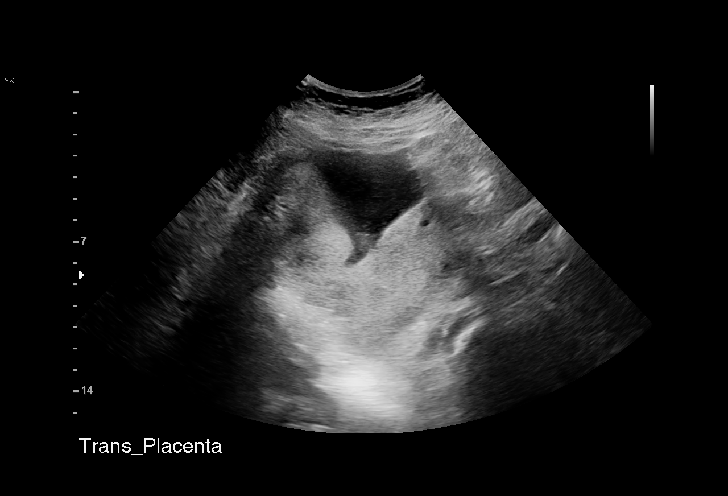
[im 25/45]
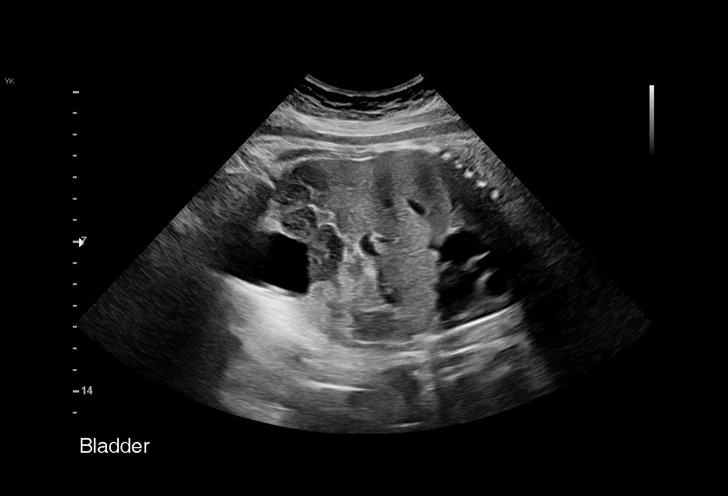
[im 28/45]
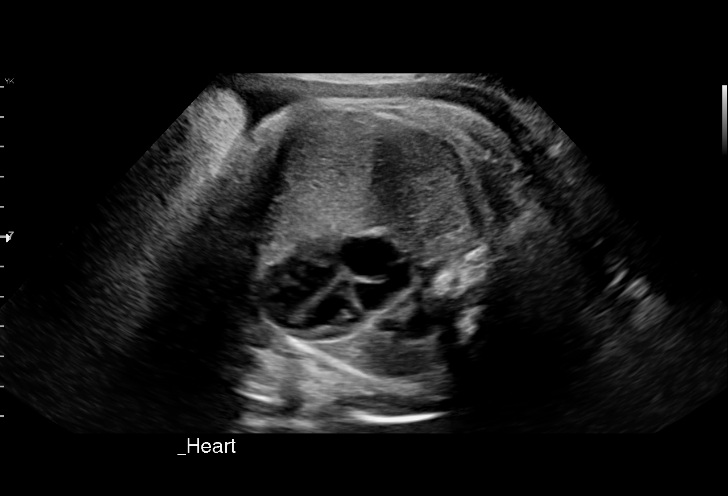
[im 31/45]
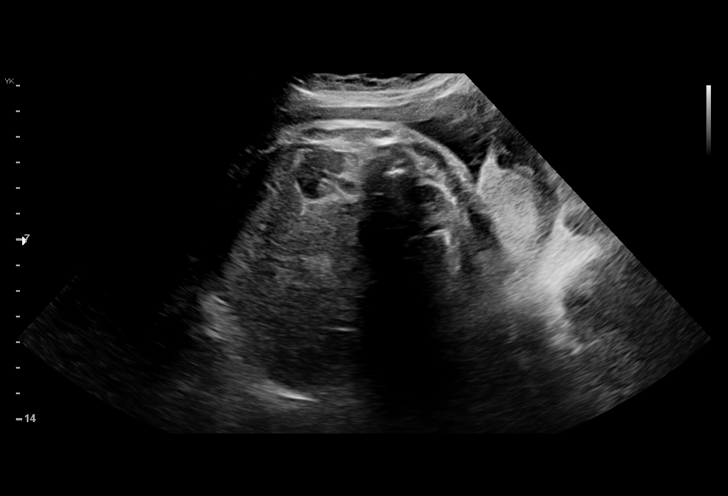
[im 35/45]
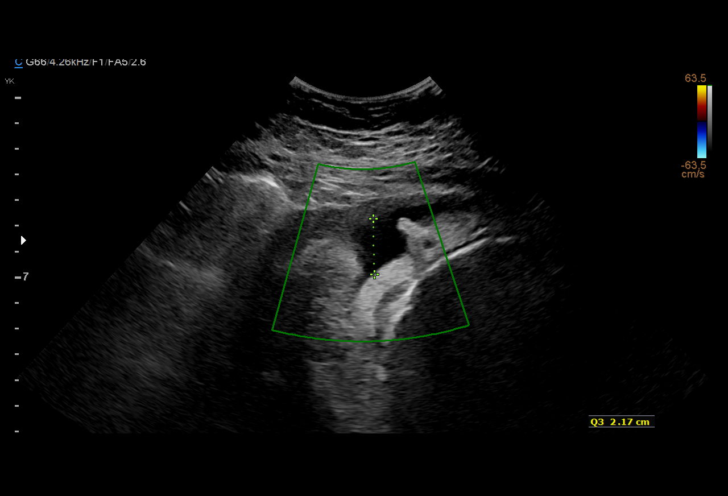
[im 38/45]
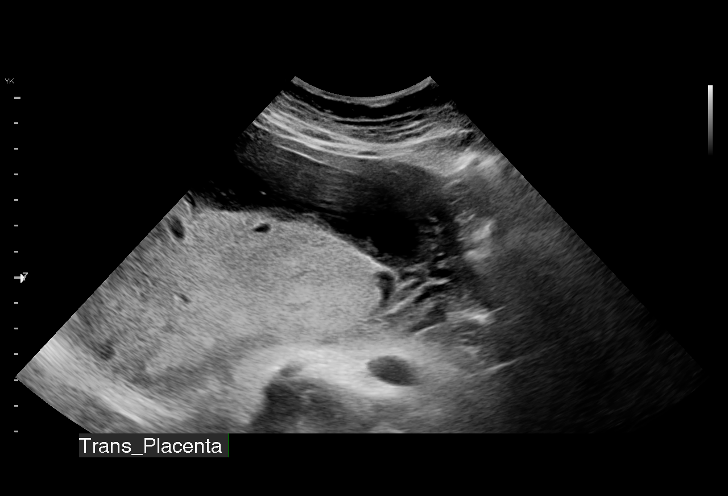
[im 41/45]
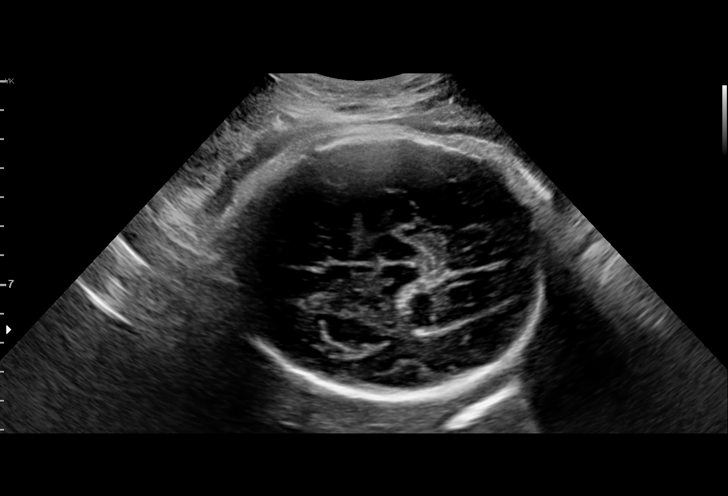
[im 45/45]
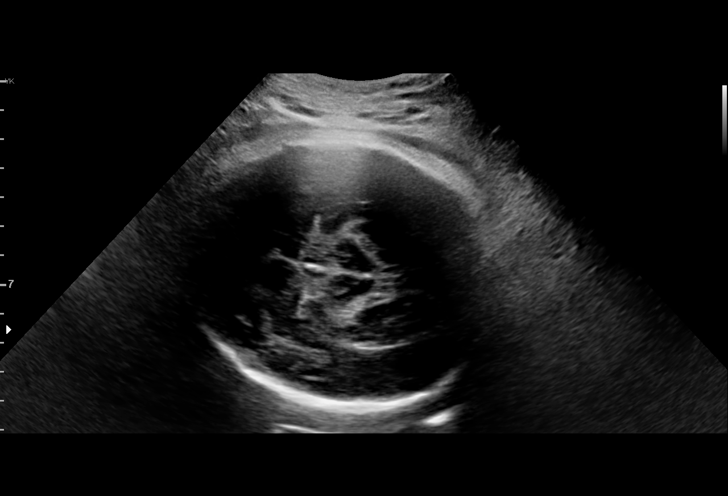

[15 of 28 positions shown; findings below may reference images not displayed]

JAVAPOWDER

                   POLIN/Triage

 ----------------------------------------------------------------------

 ----------------------------------------------------------------------
Indications

  Fetal heart rate decelerations affecting
  management of mother
  Vaginal bleeding in pregnancy, third trimester
  Abdominal pain in pregnancy
  38 weeks gestation of pregnancy
  38 weeks gestation of pregnancy
 ----------------------------------------------------------------------
Fetal Evaluation

 Num Of Fetuses:          1
 Fetal Heart Rate(bpm):   155
 Cardiac Activity:        Observed
 Presentation:            Cephalic
 Placenta:                Posterior

 Amniotic Fluid
 AFI FV:      Within normal limits

 AFI Sum(cm)     %Tile       Largest Pocket(cm)
 10.99           35

 RUQ(cm)       RLQ(cm)       LUQ(cm)        LLQ(cm)

Biophysical Evaluation

 Amniotic F.V:   Within normal limits       F. Tone:         Observed
 F. Movement:    Observed                   Score:           [DATE]
 F. Breathing:   Not Observed
Gestational Age

 LMP:           38w 5d        Date:  11/12/17                 EDD:   08/19/18
 Best:          38w 5d     Det. By:  LMP  (11/12/17)          EDD:   08/19/18
Anatomy

 Cavum:                 Appears normal         Kidneys:                Appear normal
 Ventricles:            Appears normal         Bladder:                Appears normal
 Stomach:               Appears normal, left
                        sided
Impression

 Patient was evaluated for c/o vaginal spotting.
 A limited ultrasound study was performed.  Fetal breathing
 movements did not meet the criteria (BPP). Amniotic fluid is
 normal and good fetal activity is seen. BPP [DATE].
                 Geronimo, Ito

## 2020-06-15 ENCOUNTER — Other Ambulatory Visit: Payer: Self-pay

## 2020-06-15 ENCOUNTER — Ambulatory Visit
Admission: EM | Admit: 2020-06-15 | Discharge: 2020-06-15 | Disposition: A | Discharge status: Home / Self Care | Payer: BC Managed Care – PPO | Attending: Emergency Medicine | Admitting: Emergency Medicine

## 2020-06-15 DIAGNOSIS — R6889 Other general symptoms and signs: Secondary | ICD-10-CM | POA: Diagnosis not present

## 2020-06-15 DIAGNOSIS — Z1152 Encounter for screening for COVID-19: Secondary | ICD-10-CM

## 2020-06-15 MED ORDER — ALBUTEROL SULFATE HFA 108 (90 BASE) MCG/ACT IN AERS
2.0000 | INHALATION_SPRAY | Freq: Four times a day (QID) | RESPIRATORY_TRACT | 2 refills | Status: DC | PRN
Start: 2020-06-15 — End: 2021-09-17

## 2020-06-15 MED ORDER — OSELTAMIVIR PHOSPHATE 75 MG PO CAPS: 75.0000 mg | ORAL_CAPSULE | Freq: Two times a day (BID) | ORAL | 0 refills | Status: AC

## 2020-06-15 NOTE — ED Triage Notes (Signed)
Pt c/o body aches, chills, headache, runny nose, and sore throat since last night.

## 2020-06-15 NOTE — Discharge Instructions (Addendum)
Your COVID test is pending - it is important to quarantine / isolate at home until your results are back. °If you test positive and would like further evaluation for persistent or worsening symptoms, you may schedule an E-visit or virtual (video) visit throughout the [facility] app or website. ° °PLEASE NOTE: If you develop severe chest pain or shortness of breath please go to the ER or call 9-1-1 for further evaluation --> DO NOT schedule electronic or virtual visits for this. °Please call our office for further guidance / recommendations as needed. ° °For information about the Covid vaccine, please visit [email or web address] °

## 2020-06-15 NOTE — ED Provider Notes (Signed)
[facility]    CSN: [id number] Arrival date & time: 06/15/20  1928      History   Chief Complaint Chief Complaint  Patient presents with  . Generalized Body Aches    HPI Kari Campbell is a 24 y.o. female  With history of asthma presenting for myalgias, chills, headache, rhinorrhea and sore throat since last night.  No known sick contacts.  Past Medical History:  Diagnosis Date  . Asthma   . Vaginal Pap smear, abnormal     Patient Active Problem List   Diagnosis Date Noted  . Pregnancy 11/17/2019  . SVD (spontaneous vaginal delivery) 11/17/2019  . Normal labor and delivery 08/11/2018    History reviewed. No pertinent surgical history.  OB History    Gravida  2   Para  2   Term  2   Preterm      AB      Living  2     SAB      IAB      Ectopic      Multiple  0   Live Births  2            Home Medications    Prior to Admission medications   Medication Sig Start Date End Date Taking? Authorizing Provider  albuterol (VENTOLIN HFA) 108 (90 Base) MCG/ACT inhaler Inhale 2 puffs into the lungs every 6 (six) hours as needed for wheezing or shortness of breath. 06/15/20  Yes Hall-Potvin, [place], PA-C  oseltamivir (TAMIFLU) 75 MG capsule Take 1 capsule (75 mg total) by mouth 2 (two) times daily for 5 days. 06/15/20 06/20/20 Yes Hall-Potvin, [place], PA-C  Prenatal Vit-Fe Fumarate-FA (PRENATAL MULTIVITAMIN) TABS tablet Take 1 tablet by mouth daily at 12 noon.    [provider]    Family History Family History  Problem Relation Age of Onset  . Thyroid disease Mother   . Diabetes Father     Social History Social History   Tobacco Use  . Smoking status: Never Smoker  . Smokeless tobacco: Never Used  Vaping Use  . Vaping Use: Never used  Substance Use Topics  . Alcohol use: Never  . Drug use: Not Currently    Types: Marijuana    Comment: marijuana use "before pregnancy"     Allergies   Patient has no  known allergies.   Review of Systems Review of Systems  Constitutional: Positive for chills. Negative for fatigue and fever.  HENT: Positive for sore throat. Negative for congestion, dental problem, ear pain, facial swelling, hearing loss, sinus pain, trouble swallowing and voice change.   Eyes: Negative for photophobia, pain and visual disturbance.  Respiratory: Negative for cough and shortness of breath.   Cardiovascular: Negative for chest pain and palpitations.  Gastrointestinal: Negative for diarrhea and vomiting.  Musculoskeletal: Positive for myalgias. Negative for arthralgias.  Neurological: Positive for headaches. Negative for dizziness.     Physical Exam Triage Vital Signs ED Triage Vitals [06/15/20 2153]  Enc Vitals Group     BP 140/84     Pulse Rate (!) 110     Resp 20     Temp 98.9 F (37.2 C)     Temp Source Oral     SpO2 98 %     Weight      Height      Head Circumference      Peak Flow      Pain Score      Pain Loc  Pain Edu?      Excl. in GC?    No data found.  Updated Vital Signs BP 140/84 (BP Location: Left Arm)   Pulse (!) 110   Temp 98.9 F (37.2 C) (Oral)   Resp 20   LMP 05/14/2020   SpO2 98%   Breastfeeding No   Visual Acuity Right Eye Distance:   Left Eye Distance:   Bilateral Distance:    Right Eye Near:   Left Eye Near:    Bilateral Near:     Physical Exam Constitutional:      General: She is not in acute distress.    Appearance: She is not ill-appearing or diaphoretic.  HENT:     Head: Normocephalic and atraumatic.     Right Ear: Tympanic membrane and ear canal normal.     Left Ear: Tympanic membrane and ear canal normal.     Mouth/Throat:     Mouth: Mucous membranes are moist.     Pharynx: Oropharynx is clear. No oropharyngeal exudate or posterior oropharyngeal erythema.  Eyes:     General: No scleral icterus.    Conjunctiva/sclera: Conjunctivae normal.     Pupils: Pupils are equal, round, and reactive to light.   Neck:     Comments: Trachea midline, negative JVD Cardiovascular:     Rate and Rhythm: Normal rate and regular rhythm.     Heart sounds: No murmur heard. No gallop.   Pulmonary:     Effort: Pulmonary effort is normal. No respiratory distress.     Breath sounds: No wheezing, rhonchi or rales.  Musculoskeletal:     Cervical back: Neck supple. No tenderness.  Lymphadenopathy:     Cervical: No cervical adenopathy.  Skin:    Capillary Refill: Capillary refill takes less than 2 seconds.     Coloration: Skin is not jaundiced or pale.     Findings: No rash.  Neurological:     General: No focal deficit present.     Mental Status: She is alert and oriented to person, place, and time.      UC Treatments / Results  Labs (all labs ordered are listed, but only abnormal results are displayed) Labs Reviewed  COVID-19, FLU A+B NAA    EKG   Radiology No results found.  Procedures Procedures (including critical care time)  Medications Ordered in UC Medications - No data to display  Initial Impression / Assessment and Plan / UC Course  I have reviewed the triage vital signs and the nursing notes.  Pertinent labs & imaging results that were available during my care of the patient were reviewed by me and considered in my medical decision making (see chart for details).     Patient afebrile, nontoxic, with SpO2 98%.  Covid PCR pending.  Patient to quarantine until results are back.  We will treat supportively as outlined below.  Return precautions discussed, patient verbalized understanding and is agreeable to plan. Final Clinical Impressions(s) / UC Diagnoses   Final diagnoses:  Encounter for screening for COVID-19  Flu-like symptoms     Discharge Instructions     Your COVID test is pending - it is important to quarantine / isolate at home until your results are back. If you test positive and would like further evaluation for persistent or worsening symptoms, you may  schedule an E-visit or virtual (video) visit throughout the [facility] app or website.  PLEASE NOTE: If you develop severe chest pain or shortness of breath please go to the  ER or call 9-1-1 for further evaluation --> DO NOT schedule electronic or virtual visits for this. Please call our office for further guidance / recommendations as needed.  For information about the Covid vaccine, please visit [email or web address]    ED Prescriptions    Medication Sig Dispense Auth. Provider   oseltamivir (TAMIFLU) 75 MG capsule Take 1 capsule (75 mg total) by mouth 2 (two) times daily for 5 days. 10 capsule Hall-Potvin, [place], PA-C   albuterol (VENTOLIN HFA) 108 (90 Base) MCG/ACT inhaler Inhale 2 puffs into the lungs every 6 (six) hours as needed for wheezing or shortness of breath. 8 g Hall-Potvin, [place], PA-C     PDMP not reviewed this encounter.   Hall-Potvin, [place], [place] 06/15/20 2230

## 2020-06-17 ENCOUNTER — Emergency Department (HOSPITAL_COMMUNITY): Payer: BC Managed Care – PPO

## 2020-06-17 ENCOUNTER — Encounter (HOSPITAL_COMMUNITY): Payer: Self-pay | Admitting: Emergency Medicine

## 2020-06-17 ENCOUNTER — Emergency Department (HOSPITAL_COMMUNITY)
Admission: EM | Admit: 2020-06-17 | Discharge: 2020-06-17 | Disposition: A | Discharge status: Home / Self Care | Payer: BC Managed Care – PPO | Attending: Emergency Medicine | Admitting: Emergency Medicine

## 2020-06-17 DIAGNOSIS — R059 Cough, unspecified: Secondary | ICD-10-CM | POA: Diagnosis present

## 2020-06-17 DIAGNOSIS — M791 Myalgia, unspecified site: Secondary | ICD-10-CM | POA: Insufficient documentation

## 2020-06-17 DIAGNOSIS — J3489 Other specified disorders of nose and nasal sinuses: Secondary | ICD-10-CM | POA: Insufficient documentation

## 2020-06-17 DIAGNOSIS — R6883 Chills (without fever): Secondary | ICD-10-CM | POA: Diagnosis not present

## 2020-06-17 DIAGNOSIS — Z79899 Other long term (current) drug therapy: Secondary | ICD-10-CM | POA: Diagnosis not present

## 2020-06-17 DIAGNOSIS — J45909 Unspecified asthma, uncomplicated: Secondary | ICD-10-CM | POA: Insufficient documentation

## 2020-06-17 DIAGNOSIS — Z20822 Contact with and (suspected) exposure to covid-19: Secondary | ICD-10-CM | POA: Diagnosis not present

## 2020-06-17 LAB — COVID-19, FLU A+B NAA
Influenza A, NAA: NOT DETECTED
Influenza B, NAA: NOT DETECTED
SARS-CoV-2, NAA: DETECTED — AB

## 2020-06-17 MED ORDER — ACETAMINOPHEN 500 MG PO TABS
1000.0000 mg | ORAL_TABLET | Freq: Once | ORAL | Status: AC
  Administered 2020-06-17: 1000 mg via ORAL
  Filled 2020-06-17: qty 2

## 2020-06-17 NOTE — ED Provider Notes (Signed)
[facility] EMERGENCY DEPARTMENT Provider Note   CSN: [id number] Arrival date & time: 06/17/20  1138     History Chief Complaint  Patient presents with  . Cough  . loss of taste    Tatyanna Cronk is a 24 y.o. female.  Edwyna Dangerfield is a 24 y.o. female with a history of asthma, who presents to the ED for evaluation of continued cough, chills, body aches and rhinorrhea.  She was seen at urgent care 2 days ago for the symptoms and has Covid and flu testing pending.  Patient states today she has had some loss of taste and smell with worsening cough.  Denies chest pain or shortness of breath.  No vomiting, diarrhea or abdominal pain.  Unknown sick contacts.  Patient had 1 dose of Pfizer vaccine but never completed vaccination series.  Has been using lemon and honey at home to manage symptoms, no other medications.  No other aggravating or alleviating factors.  The history is provided by the patient.       Past Medical History:  Diagnosis Date  . Asthma   . Vaginal Pap smear, abnormal     Patient Active Problem List   Diagnosis Date Noted  . Pregnancy 11/17/2019  . SVD (spontaneous vaginal delivery) 11/17/2019  . Normal labor and delivery 08/11/2018    History reviewed. No pertinent surgical history.   OB History    Gravida  2   Para  2   Term  2   Preterm      AB      Living  2     SAB      IAB      Ectopic      Multiple  0   Live Births  2           Family History  Problem Relation Age of Onset  . Thyroid disease Mother   . Diabetes Father     Social History   Tobacco Use  . Smoking status: Never Smoker  . Smokeless tobacco: Never Used  Vaping Use  . Vaping Use: Never used  Substance Use Topics  . Alcohol use: Never  . Drug use: Not Currently    Types: Marijuana    Comment: marijuana use "before pregnancy"    Home Medications Prior to Admission medications   Medication Sig Start Date End Date  Taking? Authorizing Provider  albuterol (VENTOLIN HFA) 108 (90 Base) MCG/ACT inhaler Inhale 2 puffs into the lungs every 6 (six) hours as needed for wheezing or shortness of breath. 06/15/20   Hall-Potvin, [place], PA-C  oseltamivir (TAMIFLU) 75 MG capsule Take 1 capsule (75 mg total) by mouth 2 (two) times daily for 5 days. 06/15/20 06/20/20  Hall-Potvin, [place], PA-C  Prenatal Vit-Fe Fumarate-FA (PRENATAL MULTIVITAMIN) TABS tablet Take 1 tablet by mouth daily at 12 noon.    [provider]    Allergies    Patient has no known allergies.  Review of Systems   Review of Systems  Constitutional: Positive for fatigue.  HENT: Positive for congestion.   Gastrointestinal: Negative for abdominal pain, diarrhea, nausea and vomiting.  Genitourinary: Negative for dysuria and frequency.  Musculoskeletal: Negative for arthralgias.  All other systems reviewed and are negative.   Physical Exam Updated Vital Signs BP (!) 148/84   Pulse (!) 109   Temp 98.1 F (36.7 C)   Resp 15   SpO2 98%   Physical Exam Vitals and nursing note reviewed.  Constitutional:  General: She is not in acute distress.    Appearance: Normal appearance. She is well-developed and normal weight. She is not ill-appearing or diaphoretic.     Comments: Well-appearing and in no distress   HENT:     Head: Normocephalic and atraumatic.     Nose: Rhinorrhea present.     Mouth/Throat:     Mouth: Mucous membranes are moist.     Pharynx: Oropharynx is clear. Posterior oropharyngeal erythema present.  Eyes:     General:        Right eye: No discharge.        Left eye: No discharge.  Neck:     Comments: No rigidity Cardiovascular:     Rate and Rhythm: Normal rate and regular rhythm.     Heart sounds: Normal heart sounds. No murmur heard. No friction rub. No gallop.   Pulmonary:     Effort: Pulmonary effort is normal. No respiratory distress.     Breath sounds: Normal breath sounds.     Comments:  Respirations equal and unlabored, patient able to speak in full sentences, lungs clear to auscultation bilaterally  Abdominal:     General: Bowel sounds are normal. There is no distension.     Palpations: Abdomen is soft. There is no mass.     Tenderness: There is no abdominal tenderness. There is no guarding.     Comments: Abdomen soft, nondistended, nontender to palpation in all quadrants without guarding or peritoneal signs  Musculoskeletal:        General: No deformity.     Cervical back: Neck supple.  Lymphadenopathy:     Cervical: No cervical adenopathy.  Skin:    General: Skin is warm and dry.     Capillary Refill: Capillary refill takes less than 2 seconds.  Neurological:     Mental Status: She is alert and oriented to person, place, and time.  Psychiatric:        Mood and Affect: Mood normal.        Behavior: Behavior normal.     ED Results / Procedures / Treatments   Labs (all labs ordered are listed, but only abnormal results are displayed) Labs Reviewed - No data to display  EKG None  Radiology DG Chest [facility] 1 View  Result Date: 06/17/2020 CLINICAL DATA:  Cough and loss of taste sensation. Reported COVID-19 positive EXAM: PORTABLE CHEST 1 VIEW COMPARISON:  None. FINDINGS: Minimal left base atelectasis. Lungs otherwise clear. Heart size and pulmonary vascularity are normal. No adenopathy no bone lesions. IMPRESSION: Minimal left base atelectasis. Lungs otherwise clear. Heart size normal. Electronically Signed   By: Bretta Bang III M.D.   On: 06/17/2020 14:25    Procedures Procedures (including critical care time)  Medications Ordered in ED Medications  acetaminophen (TYLENOL) tablet 1,000 mg (1,000 mg Oral Given 06/17/20 1427)    ED Course  I have reviewed the triage vital signs and the nursing notes.  Pertinent labs & imaging results that were available during my care of the patient were reviewed by me and considered in my medical decision making (see  chart for details).    MDM Rules/Calculators/A&P                           24 year old female presents with 5 days of cough, chills, body aches, today with loss of taste and smell.  Was seen at [facility] urgent care on 12/28 and has a Covid and flu test  pending, presents today, concerned and wanting sooner results regarding her Covid status.  Discussed with patient that I think it is very likely that she has Covid.  Fortunately her symptoms seem mild, she is afebrile today, mildly tachycardic on arrival but on my exam patient with normal heart rate, no hypoxia or increased work of breathing and chest x-ray with some minimal left base atelectasis but otherwise clear.  Do not feel that patient needs more rapid Covid test but I have instructed her to quarantine at home and at this point assume she has Covid until she receives results which should be back later today or tomorrow.  Discussed return precautions and supportive care at home.  Patient expresses understanding and agreement with plan.  Discharged home in good condition.  Eyla Tallon was evaluated in Emergency Department on 06/17/2020 for the symptoms described in the history of present illness. She was evaluated in the context of the global COVID-19 pandemic, which necessitated consideration that the patient might be at risk for infection with the SARS-CoV-2 virus that causes COVID-19. Institutional protocols and algorithms that pertain to the evaluation of patients at risk for COVID-19 are in a state of rapid change based on information released by regulatory bodies including the CDC and federal and state organizations. These policies and algorithms were followed during the patient's care in the ED.   Final Clinical Impression(s) / ED Diagnoses Final diagnoses:  Suspected COVID-19 virus infection    Rx / DC Orders ED Discharge Orders    None       Legrand Rams 06/17/20 1833    Gwyneth Sprout, MD 06/17/20 2103

## 2020-06-17 NOTE — Discharge Instructions (Signed)
I suspect you have a COVID-19 infection, your test from urgent care is still pending.  Please continue to quarantine at home and monitor your symptoms closely. You chest x-ray was clear. Antibiotics are not helpful in treating viral infection, the virus should run its course in about 7-10 days. Please make sure you are drinking plenty of fluids. You can treat your symptoms supportively with tylenol for fevers and pains, and over the counter cough syrups and throat lozenges to help with cough. If your symptoms are not improving please follow up with you Primary doctor.   I recommend that you purchase a home pulse ox to help better monitor your oxygen at home, if you start to have increased work of breathing or shortness of breath or your oxygen drops below 90% please immediately return to the hospital for reevaluation.  If you develop persistent fevers, shortness of breath or difficulty breathing, chest pain, severe headache and neck pain, persistent nausea and vomiting or other new or concerning symptoms return to the Emergency department.

## 2020-06-17 NOTE — ED Triage Notes (Addendum)
Pt reports cough, chills and body aches since Sunday, loss of taste and smell today. Denies recent sick contacts. Seen at [facility] on 12/28, had covid test there that is pending.

## 2021-03-01 IMAGING — US US MFM OB LIMITED
1 series · 15 of 28 positions shown · non-contrast
Comparison: none

[Series 1: us mfm ob limited · 15 of 33 slices shown]
[im 1/33]
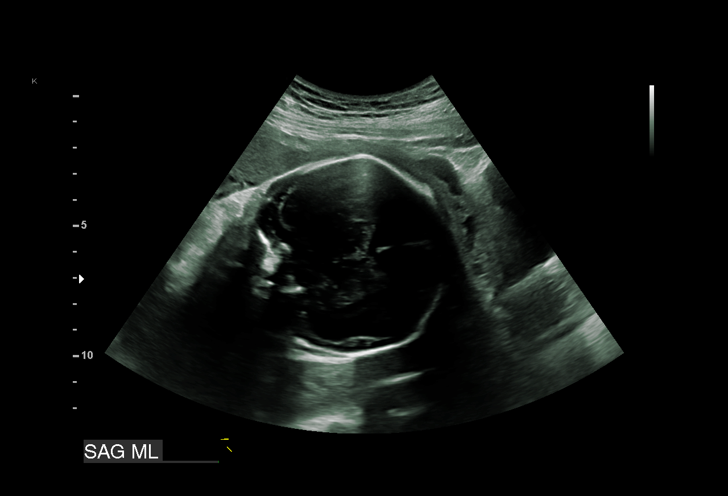
[im 3/33]
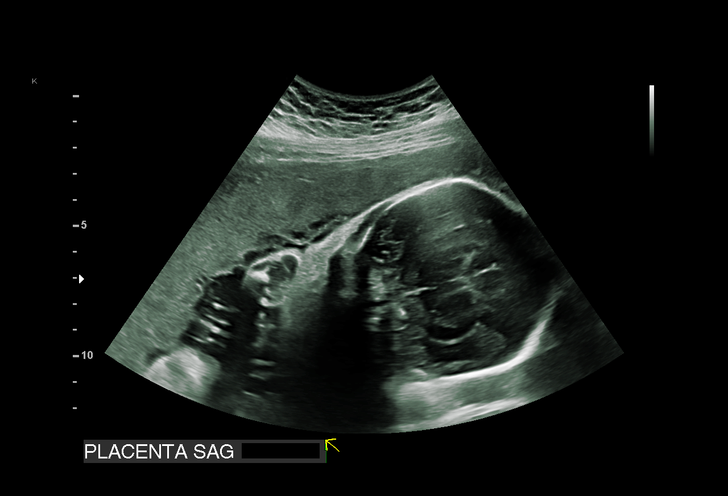
[im 5/33]
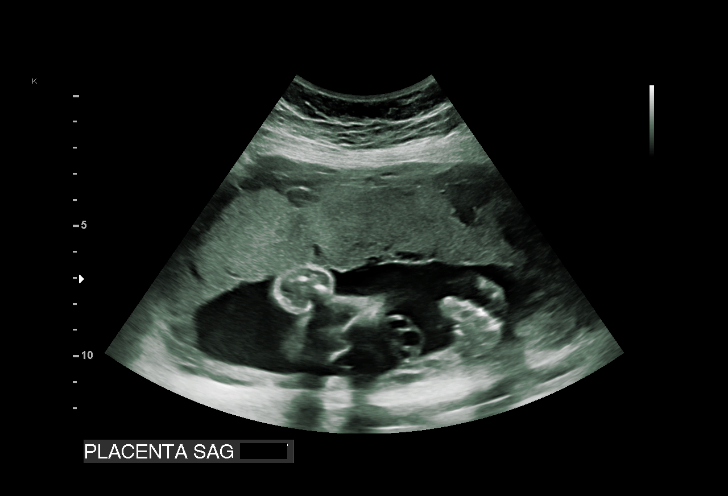
[im 8/33]
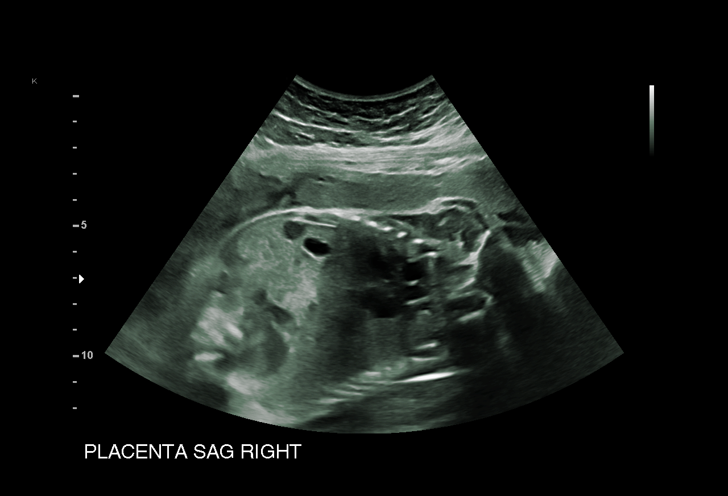
[im 10/33]
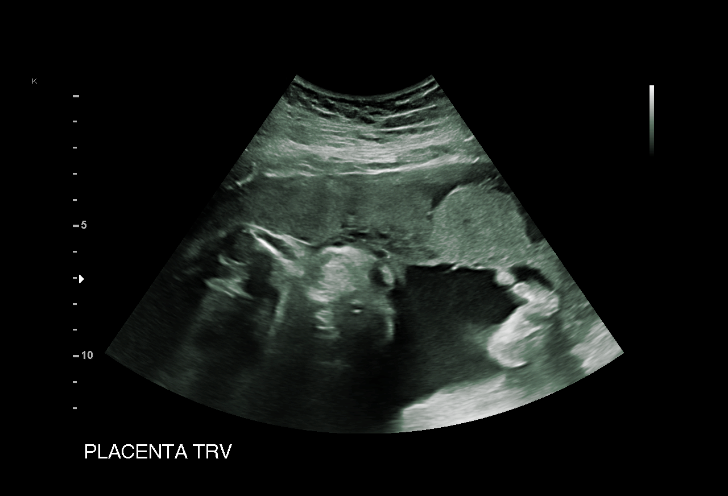
[im 12/33]
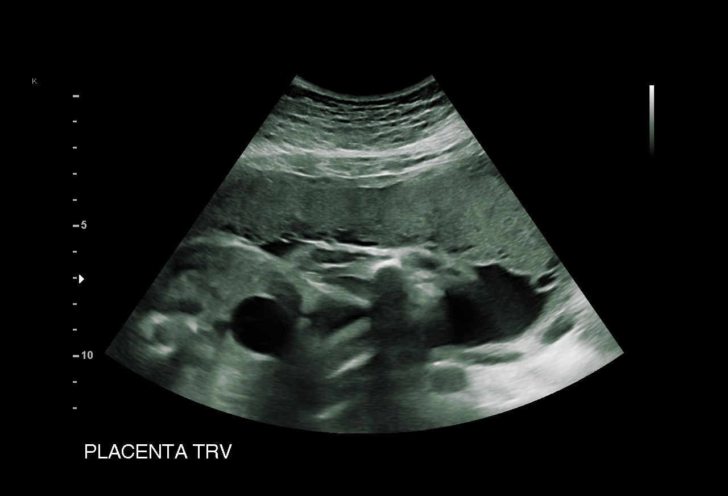
[im 15/33]
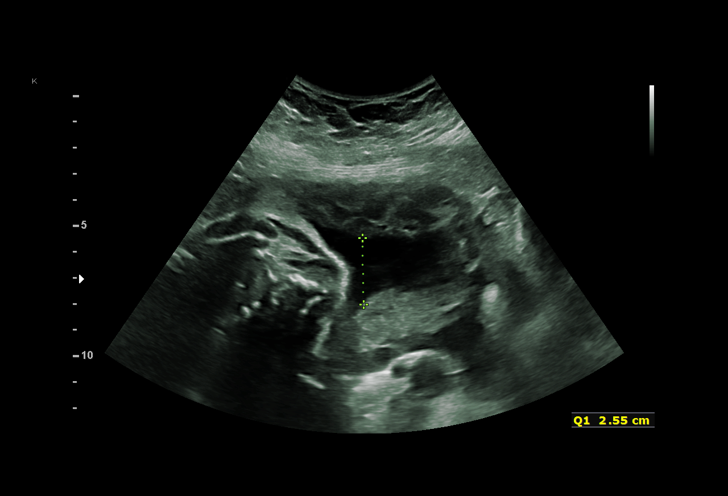
[im 17/33]
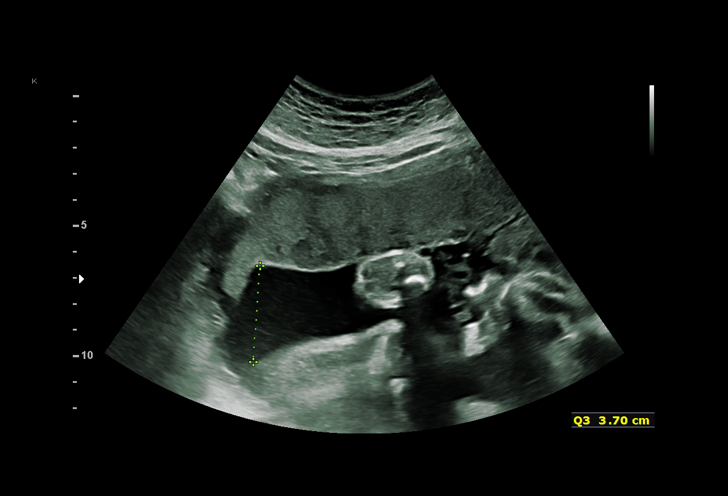
[im 18/33]
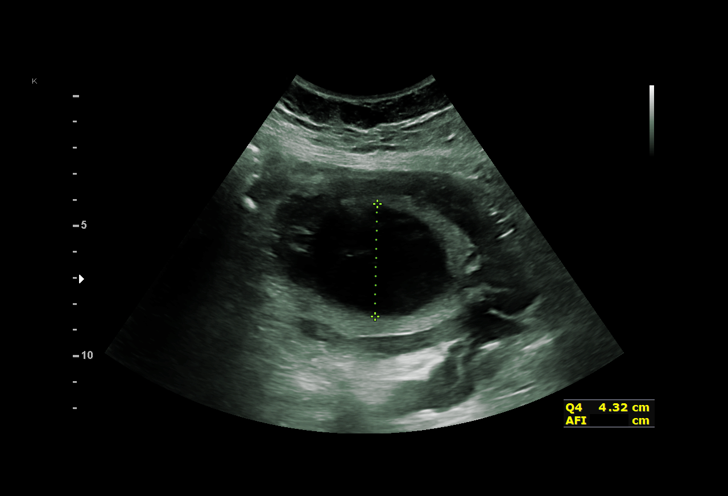
[im 21/33]
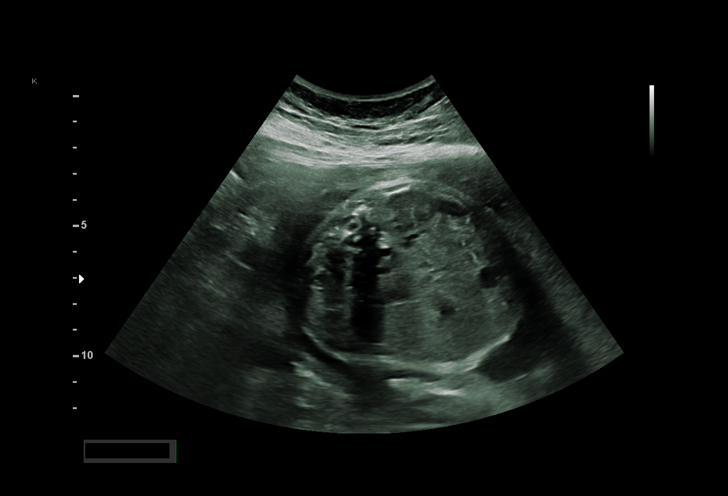
[im 23/33]
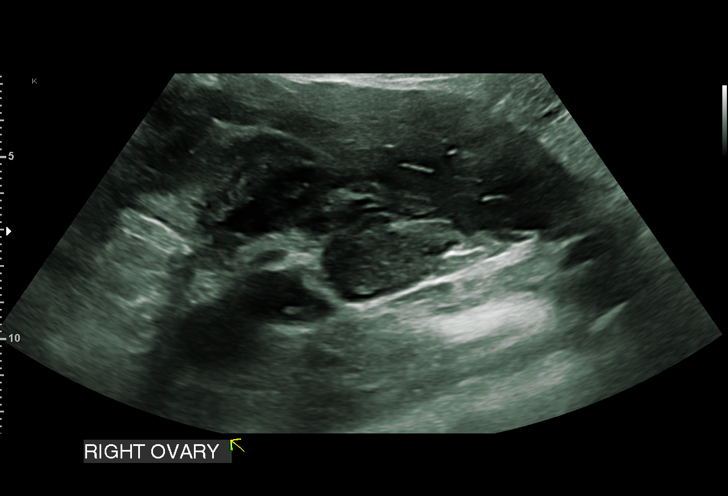
[im 25/33]
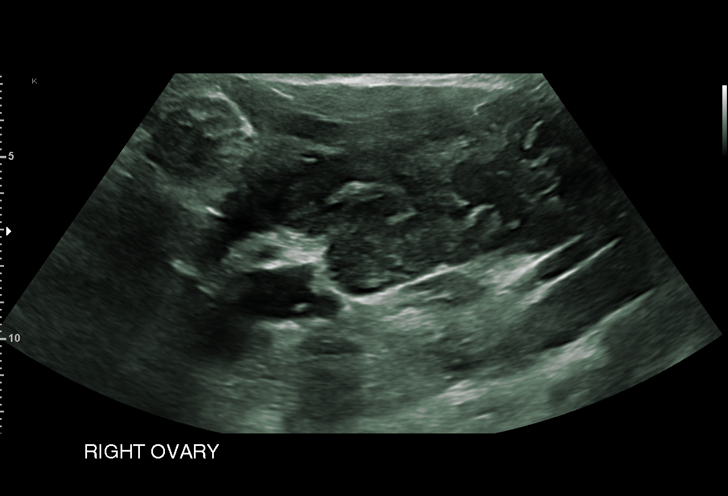
[im 28/33]
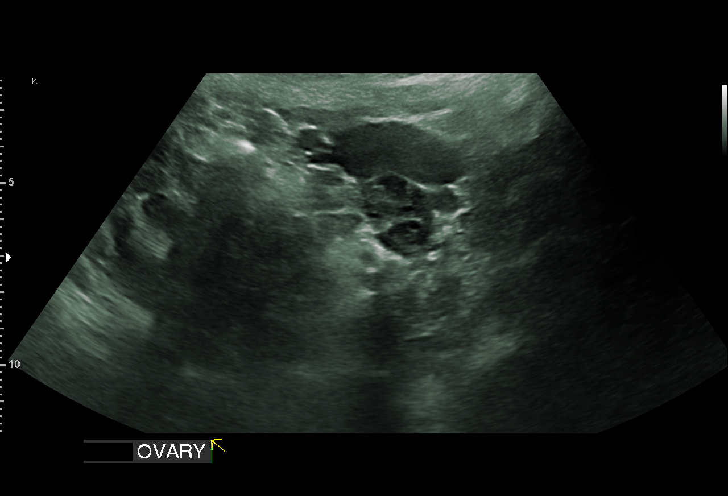
[im 30/33]
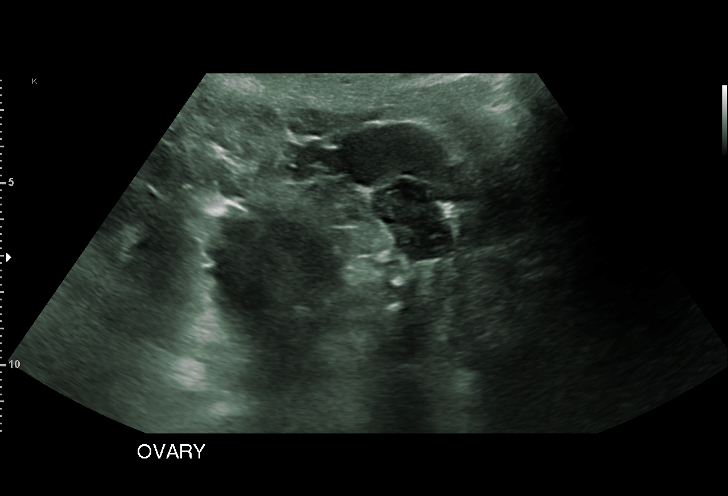
[im 33/33]
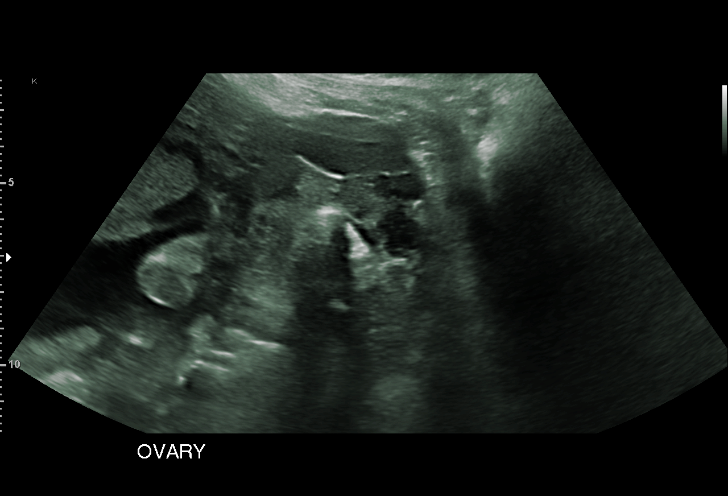

[15 of 28 positions shown; findings below may reference images not displayed]

SORIN

                   [REDACTED]-
                   Faculty Physician

  1  US MFM OB LIMITED                    76815.01     HJ SAMAT SIMAH
 ----------------------------------------------------------------------

 ----------------------------------------------------------------------
Indications

  Vaginal bleeding in pregnancy, third trimester
  29 weeks gestation of pregnancy
 ----------------------------------------------------------------------
Fetal Evaluation

 Num Of Fetuses:          1
 Fetal Heart Rate(bpm):   144
 Cardiac Activity:        Observed
 Presentation:            Cephalic
 Placenta:                Anterior

 Amniotic Fluid
 AFI FV:      Within normal limits

 AFI Sum(cm)     %Tile       Largest Pocket(cm)
 13.43           41

 RUQ(cm)       RLQ(cm)       LUQ(cm)        LLQ(cm)


 Comment:    No placental abruption or previa identified.
OB History

 Gravidity:    2         Term:   1        Prem:   0        SAB:   0
 TOP:          0       Ectopic:  0        Living: 1
Gestational Age
 LMP:           30w 4d        Date:  02/03/19                 EDD:   11/10/19
 Clinical EDD:  28w 6d                                        EDD:   11/22/19
 Best:          29w 0d     Det. By:  Early Ultrasound         EDD:   11/21/19
                                     (04/17/19)
Anatomy

 Ventricles:            Appears normal         Kidneys:                Appear normal
 Stomach:               Appears normal, left   Bladder:                Appears normal
                        sided
Cervix Uterus Adnexa

 Cervix
 Not visualized (advanced GA >31wks)

 Left Ovary
 Within normal limits.

 Right Ovary
 Within normal limits.
Comments

 A limited ultrasound performed today shows that the fetus is
 in the vertex presentation.

 There was normal amniotic fluid noted.

## 2021-09-17 ENCOUNTER — Ambulatory Visit (HOSPITAL_COMMUNITY)
Admission: EM | Admit: 2021-09-17 | Discharge: 2021-09-17 | Disposition: A | Discharge status: Home / Self Care | Payer: Medicaid Other | Attending: Urgent Care | Admitting: Urgent Care

## 2021-09-17 ENCOUNTER — Encounter (HOSPITAL_COMMUNITY): Payer: Self-pay | Admitting: Emergency Medicine

## 2021-09-17 DIAGNOSIS — H02849 Edema of unspecified eye, unspecified eyelid: Secondary | ICD-10-CM

## 2021-09-17 DIAGNOSIS — H02844 Edema of left upper eyelid: Secondary | ICD-10-CM | POA: Diagnosis not present

## 2021-09-17 DIAGNOSIS — H02841 Edema of right upper eyelid: Secondary | ICD-10-CM

## 2021-09-17 MED ORDER — PREDNISONE 50 MG PO TABS
50.0000 mg | ORAL_TABLET | Freq: Every day | ORAL | 0 refills | Status: AC
Start: 2021-09-17 — End: 2021-09-21

## 2021-09-17 NOTE — Discharge Instructions (Signed)
Your eyelid swelling is likely an allergic reaction. ?Please start taking prednisone as prescribed. Best taken in the morning as it can cause insomnia. ?You can take 25-50mg  benadryl three times daily. This may make you tired. ?Monitor for any new symptoms - swelling of the lips or tongue, shortness of breath or wheezing, or trouble swallowing. If these occur, head to the ER ?

## 2021-09-17 NOTE — ED Provider Notes (Signed)
?[facility] ? ? ? ?CSN: [id number] ?Arrival date & time: 09/17/21  1610 ? ? ?  ? ?History   ?Chief Complaint ?Chief Complaint  ?Patient presents with  ? Facial Swelling  ? ? ?HPI ?Kari Campbell is a 26 y.o. female.  ? ?Pleasant 26 year old female presents today due to concerns of bilateral eyelid swelling.  She states it started around noon today.  She reports waking up at her normal state of health, but did have a mild sinus headache.  She took a new over-the-counter Advil dual action with acetaminophen tablet, which she has never taken before.  She reports that her headache improved, but started developing bilateral eyelid swelling.  The swelling also started after eating lunch, but reports eating a meal that she has eaten numerous times before.  No new foods consumed.  She states her eyelid started out as mild itching, then followed by swelling.  There is no blurred vision, redness, drainage.  She denies any additional URI symptoms, and specifically denies lip swelling, tongue swelling, dysphagia, drooling, intolerance of secretions, wheezing, shortness of breath.  She has no rash.  She denies pain with range of motion of her eyes, but states that her eyelids feel "heavy".  She took 1 dose of over-the-counter Benadryl and feels that the swelling improved to some degree.  She also admits that her headache has improved with the over-the-counter Advil dual action. ? ? ? ?Past Medical History:  ?Diagnosis Date  ? Asthma   ? Vaginal Pap smear, abnormal   ? ? ?Patient Active Problem List  ? Diagnosis Date Noted  ? Pregnancy 11/17/2019  ? SVD (spontaneous vaginal delivery) 11/17/2019  ? Normal labor and delivery 08/11/2018  ? ? ?History reviewed. No pertinent surgical history. ? ?OB History   ? ? Gravida  ?2  ? Para  ?2  ? Term  ?2  ? Preterm  ?   ? AB  ?   ? Living  ?2  ?  ? ? SAB  ?   ? IAB  ?   ? Ectopic  ?   ? Multiple  ?0  ? Live Births  ?2  ?   ?  ?  ? ? ? ?Home Medications   ? ?Prior to  Admission medications   ?Medication Sig Start Date End Date Taking? Authorizing Provider  ?predniSONE (DELTASONE) 50 MG tablet Take 1 tablet (50 mg total) by mouth daily with breakfast for 4 days. 09/17/21 09/21/21 Yes Janeka Libman L, PA  ? ? ?Family History ?Family History  ?Problem Relation Age of Onset  ? Thyroid disease Mother   ? Diabetes Father   ? ? ?Social History ?Social History  ? ?Tobacco Use  ? Smoking status: Never  ? Smokeless tobacco: Never  ?Vaping Use  ? Vaping Use: Never used  ?Substance Use Topics  ? Alcohol use: Never  ? Drug use: Not Currently  ?  Types: Marijuana  ?  Comment: marijuana use "before pregnancy"  ? ? ? ?Allergies   ?Patient has no known allergies. ? ? ?Review of Systems ?Review of Systems  ?Eyes:  Positive for itching. Negative for photophobia, pain, discharge, redness and visual disturbance.  ?     Bilateral eyelid swelling  ?All other systems reviewed and are negative. ? ? ?Physical Exam ?Triage Vital Signs ?ED Triage Vitals  ?Enc Vitals Group  ?   BP 09/17/21 1736 112/68  ?   Pulse Rate 09/17/21 1736 65  ?   Resp 09/17/21  1736 17  ?   Temp 09/17/21 1736 98.3 ?F (36.8 ?C)  ?   Temp Source 09/17/21 1736 Oral  ?   SpO2 09/17/21 1736 98 %  ?   Weight --   ?   Height --   ?   Head Circumference --   ?   Peak Flow --   ?   Pain Score 09/17/21 1734 0  ?   Pain Loc --   ?   Pain Edu? --   ?   Excl. in GC? --   ? ?No data found. ? ?Updated Vital Signs ?BP 112/68 (BP Location: Right Arm)   Pulse 65   Temp 98.3 ?F (36.8 ?C) (Oral)   Resp 17   LMP 09/16/2021   SpO2 98%  ? ?Visual Acuity ?Right Eye Distance:   ?Left Eye Distance:   ?Bilateral Distance:   ? ?Right Eye Near:   ?Left Eye Near:    ?Bilateral Near:    ? ?Physical Exam ?Vitals and nursing note reviewed. Exam conducted with a chaperone present.  ?Constitutional:   ?   General: She is not in acute distress. ?   Appearance: Normal appearance. She is normal weight. She is not ill-appearing, toxic-appearing or diaphoretic.  ?HENT:   ?   Head: Normocephalic and atraumatic.  ?   Right Ear: Tympanic membrane, ear canal and external ear normal. There is no impacted cerumen.  ?   Left Ear: Tympanic membrane, ear canal and external ear normal. There is no impacted cerumen.  ?   Nose: Nose normal. No congestion or rhinorrhea.  ?   Mouth/Throat:  ?   Mouth: Mucous membranes are moist.  ?   Pharynx: Oropharynx is clear. No oropharyngeal exudate or posterior oropharyngeal erythema.  ?Eyes:  ?   General: No scleral icterus.    ?   Right eye: No discharge.     ?   Left eye: No discharge.  ?   Extraocular Movements: Extraocular movements intact.  ?   Conjunctiva/sclera: Conjunctivae normal.  ?   Pupils: Pupils are equal, round, and reactive to light.  ?   Comments: Normal EOM ?No erythema, injection or chemosis ?No hyphema ?VA unaffected, normal peripheral vision ?Bilateral upper eyelids swollen/ puffy WITHOUT erythema, drainage or tenderness ?No rash.  ?Cardiovascular:  ?   Rate and Rhythm: Normal rate and regular rhythm.  ?Pulmonary:  ?   Effort: Pulmonary effort is normal.  ?   Breath sounds: Normal breath sounds. No wheezing or rhonchi.  ?Musculoskeletal:  ?   Cervical back: Normal range of motion and neck supple. No rigidity or tenderness.  ?Lymphadenopathy:  ?   Cervical: No cervical adenopathy.  ?Skin: ?   General: Skin is warm.  ?   Capillary Refill: Capillary refill takes less than 2 seconds.  ?   Coloration: Skin is not jaundiced.  ?   Findings: No erythema or rash.  ?Neurological:  ?   General: No focal deficit present.  ?   Mental Status: She is alert and oriented to person, place, and time.  ?   Cranial Nerves: No cranial nerve deficit.  ?Psychiatric:     ?   Mood and Affect: Mood normal.     ?   Behavior: Behavior normal.     ?   Thought Content: Thought content normal.  ? ? ? ?UC Treatments / Results  ?Labs ?(all labs ordered are listed, but only abnormal results are displayed) ?Labs Reviewed - No data  to display ? ?EKG ? ? ?Radiology ?No  results found. ? ?Procedures ?Procedures (including critical care time) ? ?Medications Ordered in UC ?Medications - No data to display ? ?Initial Impression / Assessment and Plan / UC Course  ?I have reviewed the triage vital signs and the nursing notes. ? ?Pertinent labs & imaging results that were available during my care of the patient were reviewed by me and considered in my medical decision making (see chart for details). ? ?  ? ?Bilateral eyelid swelling - some response to benadryl. No concerning s/sx for infectious process. No systemic allergic reaction symptoms. Possibly secondary to a new NSAID used for her headache earlier, recommended avoidance of this medication in the future. Will start PO prednisone x 4 days, may continue the benadryl PRN. Can switch to OTC cetirizine if desired if benadryl too sedating. Monitor for any worsening sx, erythema of eyes, fever, or any new concerning symptoms which may prompt follow up. ? ?Final Clinical Impressions(s) / UC Diagnoses  ? ?Final diagnoses:  ?Swelling of eyelid, unspecified laterality  ? ? ? ?Discharge Instructions   ? ?  ?Your eyelid swelling is likely an allergic reaction. ?Please start taking prednisone as prescribed. Best taken in the morning as it can cause insomnia. ?You can take 25-50mg  benadryl three times daily. This may make you tired. ?Monitor for any new symptoms - swelling of the lips or tongue, shortness of breath or wheezing, or trouble swallowing. If these occur, head to the ER ? ? ? ? ?ED Prescriptions   ? ? Medication Sig Dispense Auth. Provider  ? predniSONE (DELTASONE) 50 MG tablet Take 1 tablet (50 mg total) by mouth daily with breakfast for 4 days. 4 tablet [place]atah, Jaicion Laurie L, PA  ? ?  ? ?PDMP not reviewed this encounter. ?  Maretta Bees, [place] ?09/18/21 1025 ? ?

## 2021-09-17 NOTE — ED Triage Notes (Signed)
Pt reports bilat eye swelling that started today. Reports itching earlier. Denies discharge or drainage.  ?

## 2021-12-12 IMAGING — DX DG CHEST 1V PORT
1 series · 1 of 1 positions shown · non-contrast
Comparison: None.

CLINICAL DATA: Cough and loss of taste sensation. Reported [id number]
positive

EXAM:
PORTABLE CHEST 1 VIEW

[chest]
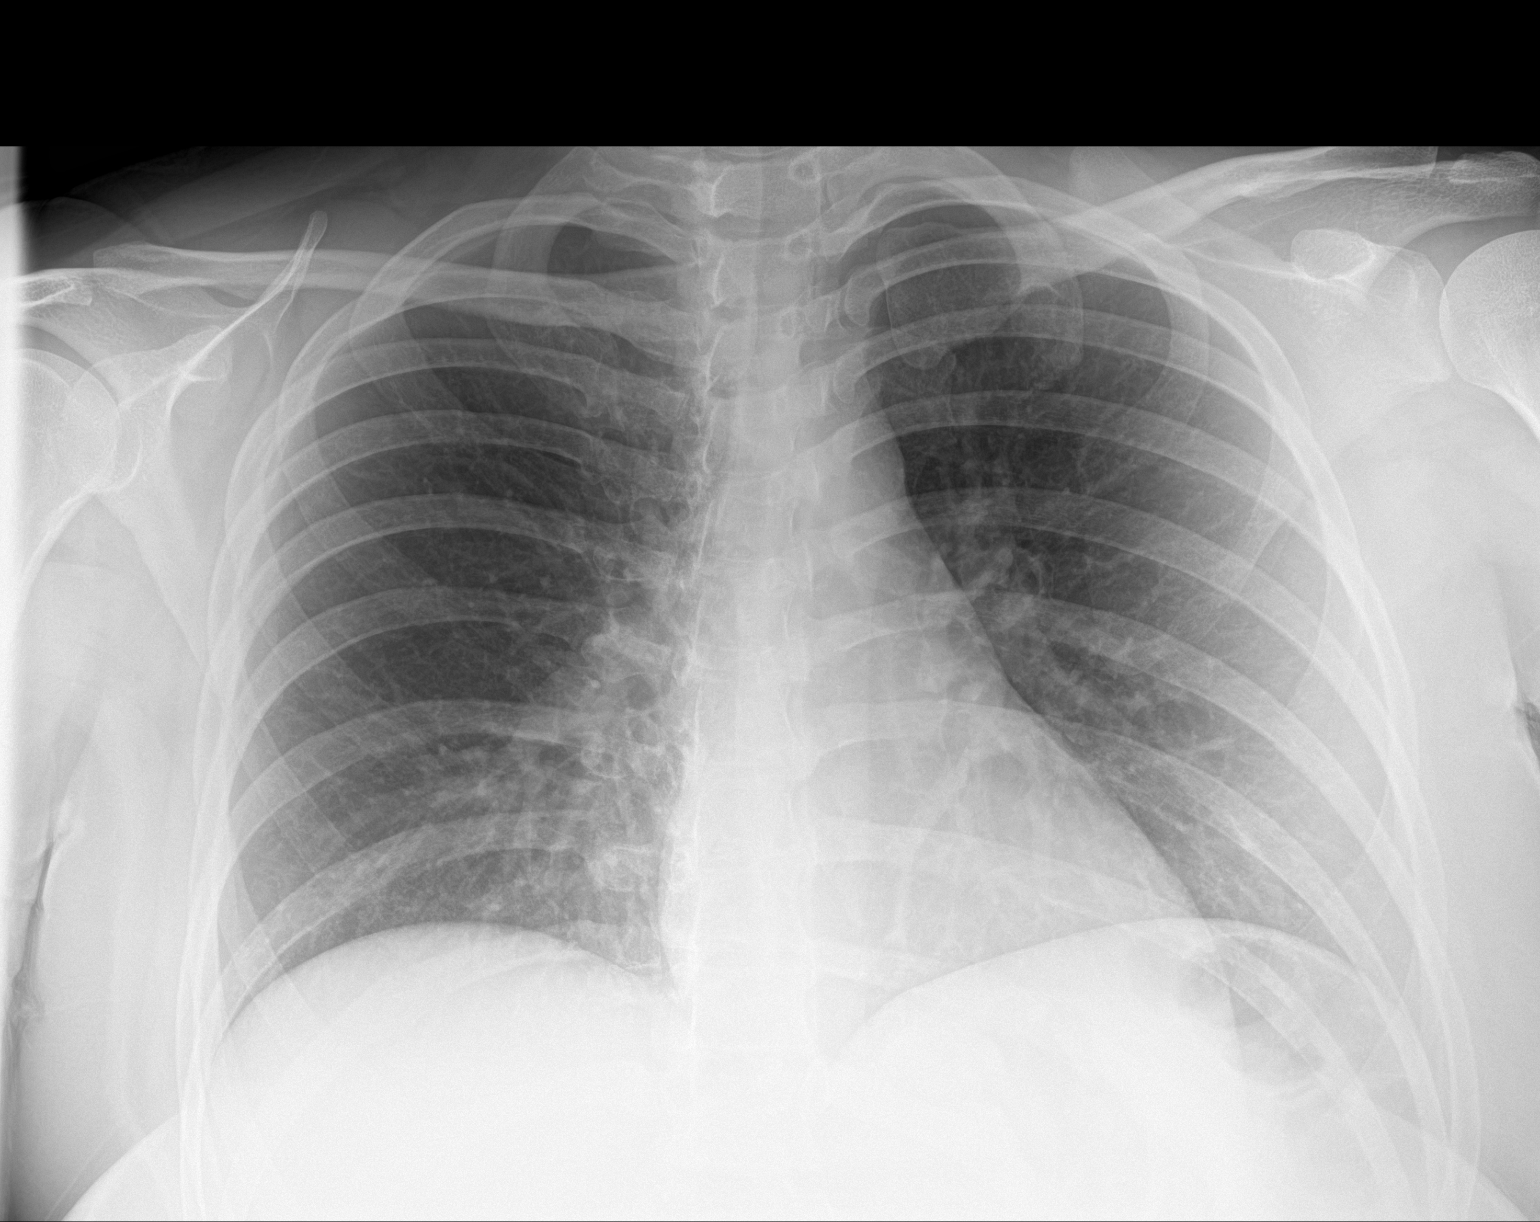

[1 of 1 positions shown; findings below may reference images not displayed]

FINDINGS: Minimal left base atelectasis. Lungs otherwise clear. Heart size and
pulmonary vascularity are normal. No adenopathy no bone lesions.
IMPRESSION: Minimal left base atelectasis. Lungs otherwise clear. Heart size
normal.

## 2021-12-20 ENCOUNTER — Other Ambulatory Visit: Payer: Self-pay

## 2021-12-20 ENCOUNTER — Encounter (HOSPITAL_COMMUNITY): Payer: Self-pay | Admitting: Emergency Medicine

## 2021-12-20 ENCOUNTER — Emergency Department (HOSPITAL_COMMUNITY)
Admission: EM | Admit: 2021-12-20 | Discharge: 2021-12-20 | Disposition: A | Discharge status: Home / Self Care | Payer: Medicaid Other | Attending: Emergency Medicine | Admitting: Emergency Medicine

## 2021-12-20 DIAGNOSIS — J45909 Unspecified asthma, uncomplicated: Secondary | ICD-10-CM | POA: Insufficient documentation

## 2021-12-20 DIAGNOSIS — E119 Type 2 diabetes mellitus without complications: Secondary | ICD-10-CM | POA: Diagnosis not present

## 2021-12-20 DIAGNOSIS — R944 Abnormal results of kidney function studies: Secondary | ICD-10-CM | POA: Diagnosis not present

## 2021-12-20 DIAGNOSIS — E876 Hypokalemia: Secondary | ICD-10-CM | POA: Insufficient documentation

## 2021-12-20 DIAGNOSIS — R55 Syncope and collapse: Secondary | ICD-10-CM | POA: Insufficient documentation

## 2021-12-20 LAB — CBC WITH DIFFERENTIAL/PLATELET
Abs Immature Granulocytes: 0.02 10*3/uL (ref 0.00–0.07)
Basophils Absolute: 0.1 10*3/uL (ref 0.0–0.1)
Basophils Relative: 1 %
Eosinophils Absolute: 0.1 10*3/uL (ref 0.0–0.5)
Eosinophils Relative: 2 %
HCT: 38.5 % (ref 36.0–46.0)
Hemoglobin: 12.5 g/dL (ref 12.0–15.0)
Immature Granulocytes: 0 %
Lymphocytes Relative: 33 %
Lymphs Abs: 1.9 10*3/uL (ref 0.7–4.0)
MCH: 28.7 pg (ref 26.0–34.0)
MCHC: 32.5 g/dL (ref 30.0–36.0)
MCV: 88.3 fL (ref 80.0–100.0)
Monocytes Absolute: 0.4 10*3/uL (ref 0.1–1.0)
Monocytes Relative: 8 %
Neutro Abs: 3.3 10*3/uL (ref 1.7–7.7)
Neutrophils Relative %: 56 %
Platelets: 170 10*3/uL (ref 150–400)
RBC: 4.36 MIL/uL (ref 3.87–5.11)
RDW: 13.7 % (ref 11.5–15.5)
WBC: 5.8 10*3/uL (ref 4.0–10.5)
nRBC: 0 % (ref 0.0–0.2)

## 2021-12-20 LAB — RAPID URINE DRUG SCREEN, HOSP PERFORMED
Amphetamines: NOT DETECTED
Barbiturates: NOT DETECTED
Benzodiazepines: NOT DETECTED
Cocaine: NOT DETECTED
Opiates: NOT DETECTED
Tetrahydrocannabinol: POSITIVE — AB

## 2021-12-20 LAB — TSH: TSH: 0.596 u[IU]/mL (ref 0.350–4.500)

## 2021-12-20 LAB — I-STAT BETA HCG BLOOD, ED (MC, WL, AP ONLY): I-stat hCG, quantitative: <5 m[IU]/mL (ref <5)

## 2021-12-20 LAB — BASIC METABOLIC PANEL
Anion gap: 10 (ref 5–15)
BUN: 15 mg/dL (ref 6–20)
CO2: 25 mmol/L (ref 22–32)
Calcium: 9.3 mg/dL (ref 8.9–10.3)
Chloride: 105 mmol/L (ref 98–111)
Creatinine, Ser: 1.03 mg/dL — ABNORMAL HIGH (ref 0.44–1.00)
GFR, Estimated: >60 mL/min (ref >60)
Glucose, Bld: 120 mg/dL — ABNORMAL HIGH (ref 70–99)
Potassium: 3.2 mmol/L — ABNORMAL LOW (ref 3.5–5.1)
Sodium: 140 mmol/L (ref 135–145)

## 2021-12-20 LAB — HEMOGLOBIN A1C
Hgb A1c MFr Bld: 5.5 % (ref 4.8–5.6)
Mean Plasma Glucose: 111.15 mg/dL

## 2021-12-20 MED ORDER — POTASSIUM CHLORIDE ER 10 MEQ PO TBCR: 10.0000 meq | EXTENDED_RELEASE_TABLET | Freq: Two times a day (BID) | ORAL | 0 refills | Status: DC

## 2021-12-20 NOTE — ED Triage Notes (Signed)
Pt brought to ED by [facility] with c/o syncopal episode in shower today, hitting her head. Pt has also had intermittent episodes of "unconsciousness" with EMS.   EMS Vitals BP 132/76 HR 82 RR  SPO2 100 CBG 106

## 2021-12-20 NOTE — Discharge Instructions (Signed)
Follow-up with a primary care doctor.  Potassium was a little low and will need a little supplementation.  Your creatinine was also just a little bit high.  Try and hydrate yourself today.  Your sugar was a little high and a hemoglobin A1c has been done but has not resulted yet.

## 2021-12-20 NOTE — ED Provider Triage Note (Signed)
  Emergency Medicine Provider Triage Evaluation Note  MRN:  [id number]  Arrival date & time: 12/20/21    Medically screening exam initiated at 3:39 AM.   CC:   Loss of Consciousness   HPI:  Kari Campbell is a 26 y.o. year-old female presents to the ED with chief complaint of syncope.  States she passed out in the shower.  Reports feeling fatigued.  Has had some chest pain. Denis hx of PE or DVT.  History provided by History provided by patient. ROS:  -As included in HPI PE:   Vitals:   12/20/21 0333  BP: 119/76  Pulse: 91  Resp: 14  Temp: 97.6 F (36.4 C)  SpO2: 98%    Non-toxic appearing No respiratory distress CTAB MDM:   I've ordered labs in triage to expedite lab/diagnostic workup.  Patient was informed that the remainder of the evaluation will be completed by another provider, this initial triage assessment does not replace that evaluation, and the importance of remaining in the ED until their evaluation is complete.    Roxy Horseman, PA-C 12/20/21 [phone]

## 2021-12-20 NOTE — ED Provider Notes (Signed)
[facility] EMERGENCY DEPARTMENT Provider Note   CSN: [id number] Arrival date & time: 12/20/21  0326     History  Chief Complaint  Patient presents with   Loss of Consciousness    Kari Campbell is a 26 y.o. female.   Loss of Consciousness Patient presents after syncopal episode.  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  Went to take a shower and then passed out.  May have hit her head.  No headache now.  Still feeling little fatigued.  Patient's partner was with her and states that she was pale.  Confused.  Potentially had stopped breathing.  A little confused to events after.  No chest pain.  No swelling in her legs.  Is been doing well otherwise.  Has been fatigued a little prior to this but thought it was just due to her work.    Past Medical History:  Diagnosis Date   Asthma    Vaginal Pap smear, abnormal     Home Medications Prior to Admission medications   Medication Sig Start Date End Date Taking? Authorizing Provider  potassium chloride (KLOR-CON) 10 MEQ tablet Take 1 tablet (10 mEq total) by mouth 2 (two) times daily. 12/20/21  Yes Benjiman Core, MD      Allergies    Patient has no known allergies.    Review of Systems   Review of Systems  Cardiovascular:  Positive for syncope.    Physical Exam Updated Vital Signs BP 96/73   Pulse 66   Temp 97.7 F (36.5 C) (Oral)   Resp 20   SpO2 99%  Physical Exam Vitals and nursing note reviewed.  Eyes:     Pupils: Pupils are equal, round, and reactive to light.  Cardiovascular:     Rate and Rhythm: Regular rhythm.  Chest:     Chest wall: No tenderness.  Abdominal:     Tenderness: There is no abdominal tenderness.  Musculoskeletal:        General: No tenderness.     Cervical back: Neck supple.  Skin:    General: Skin is warm.     Capillary Refill: Capillary refill takes less than 2 seconds.  Neurological:     Mental Status: She is alert and oriented to  person, place, and time.     ED Results / Procedures / Treatments   Labs (all labs ordered are listed, but only abnormal results are displayed) Labs Reviewed  BASIC METABOLIC PANEL - Abnormal; Notable for the following components:      Result Value   Potassium 3.2 (*)    Glucose, Bld 120 (*)    Creatinine, Ser 1.03 (*)    All other components within normal limits  CBC WITH DIFFERENTIAL/PLATELET  RAPID URINE DRUG SCREEN, HOSP PERFORMED  HEMOGLOBIN A1C  TSH  CBG MONITORING, ED  I-STAT BETA HCG BLOOD, ED ([facility], [facility], [facility] ONLY)    EKG EKG Interpretation  Date/Time:  Tuesday December 20 2021 03:36:29 EDT Ventricular Rate:  87 PR Interval:  146 QRS Duration: 108 QT Interval:  390 QTC Calculation: 469 R Axis:   13 Text Interpretation: Normal sinus rhythm Cannot rule out Anterior infarct , age undetermined Abnormal ECG No previous ECGs available Confirmed by Benjiman Core [phone]) on 12/20/2021 8:44:10 AM  Radiology No results found.  Procedures Procedures    Medications Ordered in ED Medications - No data to display  ED Course/ Medical Decision Making/ A&P  Medical Decision Making Amount and/or Complexity of Data Reviewed Labs: ordered.  Risk Prescription drug management.   Patient with a syncopal episode.  Was at home.  Fell.  Reportedly was pale with the episode.  EKG reassuring.  Lab work shows mildly elevated creatinine with unknown baseline.  Potassium also slightly low.  We will supplement orally.  Not orthostatic.  Feeling better.  Has been in the ER for around 6 hours now.  No longer pale.  Sugar is mildly elevated which potentially could be diabetes or prediabetes.  Hemoglobin A1c has been ordered but has not resulted yet.  Also TSH done due to fatigue.  Do not need to wait for the results of this point.  Can follow-up with PCP.  Will discharge home.        Final Clinical Impression(s) / ED Diagnoses Final diagnoses:  Syncope,  unspecified syncope type  Hypokalemia    Rx / DC Orders ED Discharge Orders          Ordered    potassium chloride (KLOR-CON) 10 MEQ tablet  2 times daily        12/20/21 9935              Benjiman Core, MD 12/20/21 [phone]

## 2021-12-20 NOTE — ED Notes (Signed)
CBG was 144 

## 2021-12-20 NOTE — ED Notes (Signed)
Pt verbalizes understanding of discharge instructions. Opportunity for questions and answers were provided. Pt discharged from the ED.   ?

## 2021-12-21 LAB — CBG MONITORING, ED: Glucose-Capillary: 144 mg/dL — ABNORMAL HIGH (ref 70–99)

## 2023-05-01 ENCOUNTER — Other Ambulatory Visit: Payer: Self-pay

## 2023-05-01 DIAGNOSIS — Z363 Encounter for antenatal screening for malformations: Secondary | ICD-10-CM

## 2023-05-01 LAB — OB RESULTS CONSOLE RPR: RPR: NONREACTIVE

## 2023-05-01 LAB — OB RESULTS CONSOLE GC/CHLAMYDIA
Chlamydia: NEGATIVE
Neisseria Gonorrhea: NEGATIVE

## 2023-05-01 LAB — OB RESULTS CONSOLE HEPATITIS B SURFACE ANTIGEN: Hepatitis B Surface Ag: NEGATIVE

## 2023-05-01 LAB — OB RESULTS CONSOLE HIV ANTIBODY (ROUTINE TESTING): HIV: NONREACTIVE

## 2023-05-01 LAB — OB RESULTS CONSOLE RUBELLA ANTIBODY, IGM: Rubella: IMMUNE

## 2023-05-01 LAB — HEPATITIS C ANTIBODY: HCV Ab: NEGATIVE

## 2023-06-07 DIAGNOSIS — O99210 Obesity complicating pregnancy, unspecified trimester: Secondary | ICD-10-CM | POA: Insufficient documentation

## 2023-06-07 DIAGNOSIS — Z8279 Family history of other congenital malformations, deformations and chromosomal abnormalities: Secondary | ICD-10-CM | POA: Insufficient documentation

## 2023-06-15 ENCOUNTER — Ambulatory Visit: Payer: Commercial Managed Care - PPO | Admitting: *Deleted

## 2023-06-15 ENCOUNTER — Ambulatory Visit (HOSPITAL_BASED_OUTPATIENT_CLINIC_OR_DEPARTMENT_OTHER): Payer: Commercial Managed Care - PPO | Admitting: Maternal & Fetal Medicine

## 2023-06-15 ENCOUNTER — Other Ambulatory Visit: Payer: Self-pay

## 2023-06-15 ENCOUNTER — Encounter: Payer: Self-pay | Admitting: *Deleted

## 2023-06-15 ENCOUNTER — Ambulatory Visit: Payer: Commercial Managed Care - PPO

## 2023-06-15 ENCOUNTER — Other Ambulatory Visit: Payer: Self-pay | Admitting: *Deleted

## 2023-06-15 VITALS — BP 124/57 | HR 82

## 2023-06-15 DIAGNOSIS — Z8279 Family history of other congenital malformations, deformations and chromosomal abnormalities: Secondary | ICD-10-CM

## 2023-06-15 DIAGNOSIS — Z363 Encounter for antenatal screening for malformations: Secondary | ICD-10-CM

## 2023-06-15 DIAGNOSIS — Z3A18 18 weeks gestation of pregnancy: Secondary | ICD-10-CM | POA: Insufficient documentation

## 2023-06-15 DIAGNOSIS — O99210 Obesity complicating pregnancy, unspecified trimester: Secondary | ICD-10-CM

## 2023-06-15 DIAGNOSIS — O99212 Obesity complicating pregnancy, second trimester: Secondary | ICD-10-CM

## 2023-06-15 NOTE — Progress Notes (Signed)
   Patient information  Patient Name: Kari Campbell  Patient MRN:   [id number]  Referring practice: MFM Referring Provider: [facility] Department [facility])  MFM CONSULT  Kari Campbell is a 27 y.o. G3P2002 at [redacted]w[redacted]d here for ultrasound and consultation. Patient Active Problem List   Diagnosis Date Noted   Family history of congenital heart defect-FOB sister born with heart problem 06/07/2023   Obesity affecting pregnancy, antepartum 06/07/2023   The patient reports that her menstrual cycles are irregular despite her knowing the first day of her last period.  I discussed that this means we should use her ultrasound today to date her pregnancy.  She reports that the father of this baby's half sister was born with some form of a congenital heart defect.  She does not know anything about this defect.  I discussed that since this is a half-sister and the defect cannot be confirmed, a fetal echo is not indicated.  Sonographic findings Single intrauterine pregnancy at 18w 5d. Fetal cardiac activity:  Observed and appears normal. Presentation: Variable. The anatomic structures that were well seen appear normal without evidence of soft markers. The anatomic survey is complete.  Fetal biometry shows the estimated fetal weight at the 40 percentile. Amniotic fluid: Within normal limits.  MVP: 4.38 cm. Placenta: Anterior Fundal. Adnexa: No abnormality visualized. Cervical length: 4.4 cm.  Recommendations -EDD should be 11/11/2023 based on  U/S (06/15/23). -Follow-up ultrasound 6 weeks to confirm biometry  There are limitations of prenatal ultrasound such as the inability to detect certain abnormalities due to poor visualization. Various factors such as fetal position, gestational age and maternal body habitus may increase the difficulty in visualizing the fetal anatomy.    Review of Systems: A review of systems was performed and was negative except per HPI   Vitals  and Physical Exam    06/15/2023   10:10 AM 12/20/2021    9:30 AM 12/20/2021    9:15 AM  Vitals with BMI  Systolic 124 108 96  Diastolic 57 68 73  Pulse 82 64 66    Sitting comfortably on the sonogram table Nonlabored breathing Normal rate and rhythm Abdomen is nontender  Past pregnancies OB History  Gravida Para Term Preterm AB Living  3 2 2   2   SAB IAB Ectopic Multiple Live Births     0 2    # Outcome Date GA Lbr Len/2nd Weight Sex Type Anes PTL Lv  3 Current           2 Term 11/17/19 [redacted]w[redacted]d 08:15 / 00:07 3.265 kg F Vag-Spont None  LIV  1 Term 08/12/18 [redacted]w[redacted]d 04:28 / 03:02 3.374 kg M Vag-Spont EPI  LIV     I spent 30 minutes reviewing the patients chart, including labs and images as well as counseling the patient about her medical conditions. Greater than 50% of the time was spent in direct face-to-face patient counseling.  Braxton Feathers  MFM, [facility]   06/15/2023  1:39 PM

## 2023-06-20 NOTE — L&D Delivery Note (Cosign Needed Addendum)
 Delivery Note Progressed quickly to complete dilation and pushed effectively to SVD  At 5:31 AM a viable and healthy female was delivered via Vaginal, Spontaneous (Presentation:   Occiput Anterior).  APGAR:8/9; weight  7+6  Placenta status: Spontaneous, Intact.  Cord: 3 vessels with the following complications: Nuchal cord x 1.    Anesthesia: None Episiotomy: None Lacerations: None   Suture Repair: none Est. Blood Loss (mL):    Mom to postpartum.  Baby to Couplet care / Skin to Skin.  Holmes Lusher 11/12/2023, 6:10 AM

## 2023-06-26 ENCOUNTER — Other Ambulatory Visit: Payer: Self-pay

## 2023-06-26 ENCOUNTER — Inpatient Hospital Stay (HOSPITAL_COMMUNITY)
Admission: AD | Admit: 2023-06-26 | Discharge: 2023-06-26 | Disposition: A | Discharge status: Home / Self Care | Payer: Commercial Managed Care - PPO | Attending: Obstetrics & Gynecology | Admitting: Obstetrics & Gynecology

## 2023-06-26 ENCOUNTER — Encounter (HOSPITAL_COMMUNITY): Payer: Self-pay | Admitting: Obstetrics & Gynecology

## 2023-06-26 DIAGNOSIS — N949 Unspecified condition associated with female genital organs and menstrual cycle: Secondary | ICD-10-CM | POA: Diagnosis not present

## 2023-06-26 DIAGNOSIS — Z3A20 20 weeks gestation of pregnancy: Secondary | ICD-10-CM | POA: Diagnosis not present

## 2023-06-26 DIAGNOSIS — R102 Pelvic and perineal pain: Secondary | ICD-10-CM | POA: Insufficient documentation

## 2023-06-26 DIAGNOSIS — O99512 Diseases of the respiratory system complicating pregnancy, second trimester: Secondary | ICD-10-CM | POA: Insufficient documentation

## 2023-06-26 DIAGNOSIS — B9789 Other viral agents as the cause of diseases classified elsewhere: Secondary | ICD-10-CM | POA: Insufficient documentation

## 2023-06-26 DIAGNOSIS — J069 Acute upper respiratory infection, unspecified: Secondary | ICD-10-CM | POA: Diagnosis not present

## 2023-06-26 DIAGNOSIS — O99612 Diseases of the digestive system complicating pregnancy, second trimester: Secondary | ICD-10-CM | POA: Insufficient documentation

## 2023-06-26 DIAGNOSIS — K219 Gastro-esophageal reflux disease without esophagitis: Secondary | ICD-10-CM | POA: Diagnosis not present

## 2023-06-26 DIAGNOSIS — O98512 Other viral diseases complicating pregnancy, second trimester: Secondary | ICD-10-CM | POA: Diagnosis not present

## 2023-06-26 LAB — URINALYSIS, ROUTINE W REFLEX MICROSCOPIC
Bilirubin Urine: NEGATIVE
Glucose, UA: NEGATIVE mg/dL
Hgb urine dipstick: NEGATIVE
Ketones, ur: 20 mg/dL — AB
Leukocytes,Ua: NEGATIVE
Nitrite: NEGATIVE
Protein, ur: NEGATIVE mg/dL
Specific Gravity, Urine: 1.016 (ref 1.005–1.030)
pH: 6 (ref 5.0–8.0)

## 2023-06-26 LAB — WET PREP, GENITAL
Clue Cells Wet Prep HPF POC: NONE SEEN
Sperm: NONE SEEN
Trich, Wet Prep: NONE SEEN
WBC, Wet Prep HPF POC: <10 (ref <10)
Yeast Wet Prep HPF POC: NONE SEEN

## 2023-06-26 MED ORDER — CYCLOBENZAPRINE HCL 5 MG PO TABS: 5.0000 mg | ORAL_TABLET | Freq: Three times a day (TID) | ORAL | 0 refills | Status: DC | PRN

## 2023-06-26 MED ORDER — FAMOTIDINE 20 MG PO TABS: 20.0000 mg | ORAL_TABLET | Freq: Two times a day (BID) | ORAL | 0 refills | Status: DC

## 2023-06-26 MED ORDER — CYCLOBENZAPRINE HCL 5 MG PO TABS
10.0000 mg | ORAL_TABLET | Freq: Once | ORAL | Status: DC
  Filled 2023-06-26: qty 2

## 2023-06-26 NOTE — Discharge Instructions (Signed)
Colds/Coughs/Allergies: Benadryl (alcohol free) 25 mg every 6 hours as needed Breath right strips Claritin Cepacol throat lozenges Chloraseptic throat spray Cold-Eeze- up to three times per day Cough drops, alcohol free Flonase (by prescription only) Guaifenesin Mucinex Robitussin DM (plain only, alcohol free) Saline nasal spray/drops Sudafed (pseudoephedrine) & Actifed ** use only after [redacted] weeks gestation and if you do not have high blood pressure Tylenol Vicks Vaporub Zinc lozenges Zyrtec

## 2023-06-26 NOTE — MAU Note (Signed)
.  Kari Campbell is a 28 y.o. at [redacted]w[redacted]d here in MAU reporting: lower back pain in the middle, abdominal pain, and pelvic pain that started about 0400 today. Also reports frequent urination that started this past Sunday. States that when she urinates she feels like it is a little bit. States that she has also noticed some yellowish dx that started about last week. Denies itching, irritation, or odor. Denies LOF and VB. Reports some flutters in fetal movement.  Onset of complaint: about a week ago Pain score: Lower back 7/10, pelvic pain 8/10, lower abdominal pain 9/10 There were no vitals filed for this visit.   FHT:141 Lab orders placed from triage: urine obtained in triage

## 2023-06-27 LAB — GC/CHLAMYDIA PROBE AMP (~~LOC~~) NOT AT ARMC
Chlamydia: NEGATIVE
Comment: NEGATIVE
Comment: NORMAL
Neisseria Gonorrhea: NEGATIVE

## 2023-06-27 LAB — CULTURE, OB URINE: Culture: 30000 — AB

## 2023-06-27 NOTE — MAU Provider Note (Signed)
 History     CSN: [id number]  Arrival date and time: 06/26/23 1710   Event Date/Time   First Provider Initiated Contact with Patient 06/26/2023  5:33 PM   Chief Complaint  Patient presents with   Abdominal Pain   Back Pain   Pelvic Pain    HPI  Lucindy Borel is a 28 y.o. G3P2002 at [redacted]w[redacted]d who presents to the MAU for pelvic pain and abdominal pressure. Started at 0400 today, feels like period cramps. Present bilateral pelvis and wraps around to back. Better w certain positions like standing. She reports urinating frequently, but no dysuria or urgency. She denies f/c, n/v, c/d, VB, LOF. She does endorse recent sick contacts and has had issues w GERD in pregnancy.   Past Medical History:  Diagnosis Date   Asthma    Normal labor and delivery 08/11/2018   Pregnancy 11/17/2019   SVD (spontaneous vaginal delivery) 11/17/2019   Vaginal Pap smear, abnormal     History reviewed. No pertinent surgical history.  Family History  Problem Relation Age of Onset   Thyroid  disease Mother    Diabetes Father     Social History   Tobacco Use   Smoking status: Former    Types: Cigarettes   Smokeless tobacco: Never  Vaping Use   Vaping status: Never Used  Substance Use Topics   Alcohol use: Never   Drug use: Not Currently    Types: Marijuana    Comment: marijuana use before pregnancy    Allergies: No Known Allergies  No medications prior to admission.    ROS reviewed and pertinent positives and negatives as documented in HPI.  Physical Exam   Blood pressure (!) 114/59, pulse 88, temperature 98.6 F (37 C), temperature source Oral, resp. rate 16, height 5' 7.5 (1.715 m), last menstrual period 01/28/2023, SpO2 100%.  Physical Exam Exam conducted with a chaperone present.  Constitutional:      General: She is not in acute distress.    Appearance: Normal appearance. She is not ill-appearing.  HENT:     Head: Normocephalic and atraumatic.  Cardiovascular:     Rate  and Rhythm: Normal rate.  Pulmonary:     Effort: Pulmonary effort is normal.     Breath sounds: Normal breath sounds.  Abdominal:     Palpations: Abdomen is soft.     Tenderness: There is no abdominal tenderness. There is no guarding.  Genitourinary:    Comments: Cervix closed/long/thick on visual exam with speculum Musculoskeletal:        General: Normal range of motion.  Skin:    General: Skin is warm and dry.     Findings: No rash.  Neurological:     General: No focal deficit present.     Mental Status: She is alert and oriented to person, place, and time.     MAU Course  Procedures  MDM 28 y.o. G3P2002 at [redacted]w[redacted]d presenting for abdominal pain and pelvic pressure which started acutely this AM. Better w positional changes. Pt well appearing on exam, no palpable ctx, cervix long/thick/closed on speculum exam. Wet prep and UA neg. Pt given Flexeril  and reported complete resolution of pain, increasing suspicion for round ligament pain. Suspect some of her muscle aches may be 2/2 viral URI - family members w same symptoms. Stable for d/c, return precautions reviewed.  Assessment and Plan  Round ligament pain Rx for Flexeril  sent  Viral URI Rec symptomatic management  Gastroesophageal reflux disease, unspecified whether esophagitis present Rx for Pepcid   sent    Alain Sor, MD OB Fellow, Faculty Practice [facility]  06/27/2023, 11:27 AM

## 2023-07-27 ENCOUNTER — Other Ambulatory Visit: Payer: Self-pay | Admitting: *Deleted

## 2023-07-27 ENCOUNTER — Ambulatory Visit: Payer: Commercial Managed Care - PPO | Admitting: *Deleted

## 2023-07-27 ENCOUNTER — Ambulatory Visit: Payer: Commercial Managed Care - PPO | Attending: Maternal & Fetal Medicine

## 2023-07-27 VITALS — BP 122/58 | HR 90

## 2023-07-27 DIAGNOSIS — Z8279 Family history of other congenital malformations, deformations and chromosomal abnormalities: Secondary | ICD-10-CM | POA: Insufficient documentation

## 2023-07-27 DIAGNOSIS — O365920 Maternal care for other known or suspected poor fetal growth, second trimester, not applicable or unspecified: Secondary | ICD-10-CM

## 2023-07-27 DIAGNOSIS — O99212 Obesity complicating pregnancy, second trimester: Secondary | ICD-10-CM | POA: Diagnosis not present

## 2023-07-27 DIAGNOSIS — O352XX0 Maternal care for (suspected) hereditary disease in fetus, not applicable or unspecified: Secondary | ICD-10-CM | POA: Diagnosis not present

## 2023-07-27 DIAGNOSIS — O99210 Obesity complicating pregnancy, unspecified trimester: Secondary | ICD-10-CM | POA: Insufficient documentation

## 2023-07-27 DIAGNOSIS — E669 Obesity, unspecified: Secondary | ICD-10-CM

## 2023-07-27 DIAGNOSIS — Z3A24 24 weeks gestation of pregnancy: Secondary | ICD-10-CM | POA: Diagnosis not present

## 2023-07-27 DIAGNOSIS — Z8249 Family history of ischemic heart disease and other diseases of the circulatory system: Secondary | ICD-10-CM

## 2023-07-27 NOTE — Progress Notes (Unsigned)
 mm

## 2023-08-22 LAB — OB RESULTS CONSOLE RPR: RPR: NONREACTIVE

## 2023-08-22 LAB — OB RESULTS CONSOLE HIV ANTIBODY (ROUTINE TESTING): HIV: NONREACTIVE

## 2023-08-28 ENCOUNTER — Ambulatory Visit: Payer: No Typology Code available for payment source | Admitting: *Deleted

## 2023-08-28 ENCOUNTER — Ambulatory Visit: Payer: No Typology Code available for payment source | Attending: Obstetrics

## 2023-08-28 VITALS — BP 127/58 | HR 108

## 2023-08-28 DIAGNOSIS — Z3A29 29 weeks gestation of pregnancy: Secondary | ICD-10-CM | POA: Insufficient documentation

## 2023-08-28 DIAGNOSIS — O365930 Maternal care for other known or suspected poor fetal growth, third trimester, not applicable or unspecified: Secondary | ICD-10-CM

## 2023-08-28 DIAGNOSIS — O365920 Maternal care for other known or suspected poor fetal growth, second trimester, not applicable or unspecified: Secondary | ICD-10-CM | POA: Diagnosis present

## 2023-08-28 DIAGNOSIS — O99212 Obesity complicating pregnancy, second trimester: Secondary | ICD-10-CM | POA: Insufficient documentation

## 2023-08-28 DIAGNOSIS — Z362 Encounter for other antenatal screening follow-up: Secondary | ICD-10-CM | POA: Insufficient documentation

## 2023-08-28 DIAGNOSIS — O352XX0 Maternal care for (suspected) hereditary disease in fetus, not applicable or unspecified: Secondary | ICD-10-CM

## 2023-08-28 DIAGNOSIS — E669 Obesity, unspecified: Secondary | ICD-10-CM | POA: Diagnosis not present

## 2023-08-28 DIAGNOSIS — O99213 Obesity complicating pregnancy, third trimester: Secondary | ICD-10-CM | POA: Diagnosis not present

## 2023-08-28 DIAGNOSIS — Z8249 Family history of ischemic heart disease and other diseases of the circulatory system: Secondary | ICD-10-CM | POA: Insufficient documentation

## 2023-09-14 ENCOUNTER — Other Ambulatory Visit (HOSPITAL_COMMUNITY): Payer: Self-pay

## 2023-09-18 ENCOUNTER — Encounter (HOSPITAL_COMMUNITY)
Admission: RE | Admit: 2023-09-18 | Discharge: 2023-09-18 | Disposition: A | Discharge status: Home / Self Care | Source: Ambulatory Visit | Attending: Obstetrics & Gynecology | Admitting: Obstetrics & Gynecology

## 2023-09-18 DIAGNOSIS — O99013 Anemia complicating pregnancy, third trimester: Secondary | ICD-10-CM | POA: Insufficient documentation

## 2023-09-18 DIAGNOSIS — Z3A32 32 weeks gestation of pregnancy: Secondary | ICD-10-CM | POA: Diagnosis not present

## 2023-09-18 DIAGNOSIS — D649 Anemia, unspecified: Secondary | ICD-10-CM | POA: Diagnosis not present

## 2023-09-18 MED ORDER — IRON SUCROSE 500 MG IVPB - SIMPLE MED
500.0000 mg | INTRAVENOUS | Status: DC
  Administered 2023-09-18: 500 mg via INTRAVENOUS
  Filled 2023-09-18: qty 500

## 2023-09-25 ENCOUNTER — Encounter (HOSPITAL_COMMUNITY)
Admission: RE | Admit: 2023-09-25 | Discharge: 2023-09-25 | Disposition: A | Discharge status: Home / Self Care | Source: Ambulatory Visit | Attending: Obstetrics & Gynecology | Admitting: Obstetrics & Gynecology

## 2023-09-25 DIAGNOSIS — O99013 Anemia complicating pregnancy, third trimester: Secondary | ICD-10-CM | POA: Diagnosis not present

## 2023-09-25 MED ORDER — IRON SUCROSE 500 MG IVPB - SIMPLE MED
500.0000 mg | INTRAVENOUS | Status: DC
  Administered 2023-09-25: 500 mg via INTRAVENOUS
  Filled 2023-09-25: qty 500

## 2023-10-17 LAB — OB RESULTS CONSOLE GBS: GBS: NEGATIVE

## 2023-10-30 ENCOUNTER — Encounter (HOSPITAL_COMMUNITY): Payer: Self-pay | Admitting: Family Medicine

## 2023-10-30 ENCOUNTER — Inpatient Hospital Stay (HOSPITAL_COMMUNITY)
Admission: AD | Admit: 2023-10-30 | Discharge: 2023-10-30 | Disposition: A | Discharge status: Home / Self Care | Attending: Family Medicine | Admitting: Family Medicine

## 2023-10-30 DIAGNOSIS — Z3A38 38 weeks gestation of pregnancy: Secondary | ICD-10-CM | POA: Diagnosis not present

## 2023-10-30 DIAGNOSIS — O26893 Other specified pregnancy related conditions, third trimester: Secondary | ICD-10-CM | POA: Insufficient documentation

## 2023-10-30 DIAGNOSIS — M545 Low back pain, unspecified: Secondary | ICD-10-CM | POA: Diagnosis not present

## 2023-10-30 DIAGNOSIS — O471 False labor at or after 37 completed weeks of gestation: Secondary | ICD-10-CM | POA: Insufficient documentation

## 2023-10-30 DIAGNOSIS — O479 False labor, unspecified: Secondary | ICD-10-CM | POA: Diagnosis not present

## 2023-10-30 LAB — URINALYSIS, ROUTINE W REFLEX MICROSCOPIC
Bilirubin Urine: NEGATIVE
Glucose, UA: NEGATIVE mg/dL
Hgb urine dipstick: NEGATIVE
Ketones, ur: NEGATIVE mg/dL
Leukocytes,Ua: NEGATIVE
Nitrite: NEGATIVE
Protein, ur: NEGATIVE mg/dL
Specific Gravity, Urine: 1.018 (ref 1.005–1.030)
pH: 6 (ref 5.0–8.0)

## 2023-10-30 LAB — WET PREP, GENITAL
Clue Cells Wet Prep HPF POC: NONE SEEN
Sperm: NONE SEEN
Trich, Wet Prep: NONE SEEN
WBC, Wet Prep HPF POC: <10 (ref <10)
Yeast Wet Prep HPF POC: NONE SEEN

## 2023-10-30 LAB — POCT FERN TEST: POCT Fern Test: NEGATIVE

## 2023-10-30 LAB — RUPTURE OF MEMBRANE (ROM)PLUS: Rom Plus: NEGATIVE

## 2023-10-30 MED ORDER — CYCLOBENZAPRINE HCL 5 MG PO TABS
5.0000 mg | ORAL_TABLET | Freq: Three times a day (TID) | ORAL | 0 refills | Status: AC | PRN
Start: 2023-10-30 — End: 2023-11-06

## 2023-10-30 MED ORDER — CYCLOBENZAPRINE HCL 5 MG PO TABS: 10.0000 mg | ORAL_TABLET | Freq: Once | ORAL | Status: DC

## 2023-10-30 NOTE — MAU Provider Note (Signed)
 R/o Rupture     S [name] is a 28 y.o. G79P2002 pregnant female at [redacted]w[redacted]d who presents to MAU today with complaint of CTX and LOF. Pt states was able to sleep last night but awoke to having a lot of back pain that she believes is ctx.  She states she's been leaking fluid that she believes is urine however leaks every time she moves. Nursing's initial fern negative. Denies VB, endorses +FM.      Receives care at [facility]. Prenatal records reviewed.  Pertinent items noted in HPI and remainder of comprehensive ROS otherwise negative.   O BP 117/69   Pulse 87   Temp 98.3 F (36.8 C) (Oral)   Resp 18   Ht 5' 7.5" (1.715 m)   Wt 93.2 kg   LMP 01/28/2023   SpO2 98%   BMI 31.70 kg/m  Physical Exam Vitals and nursing note reviewed. Exam conducted with a chaperone present.  Constitutional:      General: She is not in acute distress.    Appearance: She is well-developed. She is not ill-appearing.  HENT:     Head: Normocephalic and atraumatic.     Mouth/Throat:     Mouth: Mucous membranes are moist.     Pharynx: Oropharynx is clear.  Eyes:     Extraocular Movements: Extraocular movements intact.  Cardiovascular:     Rate and Rhythm: Normal rate.  Pulmonary:     Effort: Pulmonary effort is normal. No respiratory distress.  Abdominal:     General: There is no distension.     Palpations: Abdomen is soft.     Tenderness: There is no abdominal tenderness.     Comments: gravid  Genitourinary:    Vagina: Normal. No vaginal discharge, tenderness or bleeding.     Comments: No pooling Skin:    General: Skin is warm and dry.  Neurological:     Mental Status: She is alert and oriented to person, place, and time.     Motor: No weakness.  Psychiatric:        Mood and Affect: Mood normal.        Behavior: Behavior normal.   NST: 135bpm, moderate variability, +accels, no decels, one single contraction in strip   MDM: MAU Course:  Nursing initial exam with negative  gross appearance of rupture and fern neg.  Spec exam negative pooling.  Collected repeat fern and ROM plus to rule out rupture.  Also collected wet prep, GC, and will collect UA as well.  Offered patient tylenol  and flexeril .  Pt states no to tylenol  and also states will take home prescription of flexeril  but doesn't want it now given she has to drive home.   Repeat Fern negative ROM plus negative Wet prep negative  GC Collected  UA non infectious   Pt stable for d/c.   AP #[redacted] weeks gestation #False labor #LBP - sending with flexeril  PRN   Discharge from MAU in stable condition with strict/usual precautions Follow up at [facility] as scheduled for ongoing prenatal care  Allergies as of 10/30/2023   No Known Allergies      Medication List     TAKE these medications    aspirin EC 81 MG tablet Take 81 mg by mouth daily. Swallow whole.   cyclobenzaprine  5 MG tablet Commonly known as: FLEXERIL  Take 1 tablet (5 mg total) by mouth 3 (three) times daily as needed for up to 7 days for muscle spasms.   prenatal multivitamin Tabs  tablet Take 1 tablet by mouth daily at 12 noon.        Ebony Goldstein, MD 10/30/2023 11:41 AM

## 2023-10-30 NOTE — MAU Note (Signed)
 Kari Campbell is a 28 y.o. at [redacted]w[redacted]d here in MAU reporting: been having a lot of pain, thinks it is contractions.  Also been leaking, thinks it may be urine, but every time she moves more fluid comes out. Was able to sleep, but as soon as she got up today, it started.   Also having constant back ache No bleeding.1 cm at last appt. Reports +FM Onset of complaint: yesterday. Pain score: abd 7, back 8 Vitals:   10/30/23 0949  BP: 111/71  Pulse: 96  Resp: 18  Temp: 98.3 F (36.8 C)  SpO2: 98%     FHT:152 Lab orders placed from triage:  fern

## 2023-10-31 LAB — GC/CHLAMYDIA PROBE AMP (~~LOC~~) NOT AT ARMC
Chlamydia: NEGATIVE
Comment: NEGATIVE
Comment: NORMAL
Neisseria Gonorrhea: NEGATIVE

## 2023-11-09 ENCOUNTER — Encounter: Payer: Self-pay | Admitting: Obstetrics & Gynecology

## 2023-11-09 ENCOUNTER — Inpatient Hospital Stay (HOSPITAL_COMMUNITY)

## 2023-11-09 ENCOUNTER — Other Ambulatory Visit: Payer: Self-pay | Admitting: Obstetrics & Gynecology

## 2023-11-09 DIAGNOSIS — O329XX0 Maternal care for malpresentation of fetus, unspecified, not applicable or unspecified: Secondary | ICD-10-CM | POA: Insufficient documentation

## 2023-11-09 NOTE — Progress Notes (Signed)
 Patient with variable fetal presentation, found to be breech today during examination at [facility].  Currently at [redacted]w[redacted]d.  She desires ECV.  Talked to Dr. Adriana Hopping, she is agreeable to doing ECV on 11/11/23.  [facility] OR and L&D notified, patient scheduled at 0800.  Called [facility], patient will be informed of scheduled procedure, to be followed by IOL if successful.  Orders signed and held.   Lenoard Rad, MD

## 2023-11-11 ENCOUNTER — Inpatient Hospital Stay (HOSPITAL_COMMUNITY)

## 2023-11-11 ENCOUNTER — Inpatient Hospital Stay (HOSPITAL_COMMUNITY)
Admission: RE | Admit: 2023-11-11 | Discharge: 2023-11-13 | DRG: 798 | Disposition: A | Discharge status: Home / Self Care | Attending: Obstetrics and Gynecology | Admitting: Obstetrics and Gynecology

## 2023-11-11 ENCOUNTER — Encounter (HOSPITAL_COMMUNITY): Payer: Self-pay | Admitting: Obstetrics & Gynecology

## 2023-11-11 ENCOUNTER — Telehealth: Payer: Self-pay | Admitting: Family Medicine

## 2023-11-11 ENCOUNTER — Other Ambulatory Visit: Payer: Self-pay

## 2023-11-11 DIAGNOSIS — Z833 Family history of diabetes mellitus: Secondary | ICD-10-CM

## 2023-11-11 DIAGNOSIS — O9902 Anemia complicating childbirth: Secondary | ICD-10-CM | POA: Diagnosis present

## 2023-11-11 DIAGNOSIS — O329XX0 Maternal care for malpresentation of fetus, unspecified, not applicable or unspecified: Secondary | ICD-10-CM | POA: Diagnosis present

## 2023-11-11 DIAGNOSIS — Z7982 Long term (current) use of aspirin: Secondary | ICD-10-CM | POA: Diagnosis not present

## 2023-11-11 DIAGNOSIS — O321XX0 Maternal care for breech presentation, not applicable or unspecified: Secondary | ICD-10-CM | POA: Diagnosis present

## 2023-11-11 DIAGNOSIS — Z3A40 40 weeks gestation of pregnancy: Secondary | ICD-10-CM | POA: Diagnosis not present

## 2023-11-11 DIAGNOSIS — Z87891 Personal history of nicotine dependence: Secondary | ICD-10-CM | POA: Diagnosis not present

## 2023-11-11 DIAGNOSIS — Z302 Encounter for sterilization: Secondary | ICD-10-CM | POA: Diagnosis not present

## 2023-11-11 DIAGNOSIS — O480 Post-term pregnancy: Secondary | ICD-10-CM | POA: Diagnosis not present

## 2023-11-11 DIAGNOSIS — Z9851 Tubal ligation status: Secondary | ICD-10-CM

## 2023-11-11 LAB — CBC
HCT: 37.9 % (ref 36.0–46.0)
Hemoglobin: 12.6 g/dL (ref 12.0–15.0)
MCH: 29.8 pg (ref 26.0–34.0)
MCHC: 33.2 g/dL (ref 30.0–36.0)
MCV: 89.6 fL (ref 80.0–100.0)
Platelets: 120 10*3/uL — ABNORMAL LOW (ref 150–400)
RBC: 4.23 MIL/uL (ref 3.87–5.11)
RDW: 15.9 % — ABNORMAL HIGH (ref 11.5–15.5)
WBC: 5.1 10*3/uL (ref 4.0–10.5)
nRBC: 0 % (ref 0.0–0.2)

## 2023-11-11 LAB — TYPE AND SCREEN
ABO/RH(D): O POS
Antibody Screen: NEGATIVE

## 2023-11-11 MED ORDER — TERBUTALINE SULFATE 1 MG/ML IJ SOLN
INTRAMUSCULAR | Status: AC
  Filled 2023-11-11: qty 1

## 2023-11-11 MED ORDER — ZOLPIDEM TARTRATE 5 MG PO TABS: 5.0000 mg | ORAL_TABLET | Freq: Every evening | ORAL | Status: DC | PRN

## 2023-11-11 MED ORDER — LACTATED RINGERS IV SOLN
500.0000 mL | Freq: Once | INTRAVENOUS | Status: DC
Start: 2023-11-11 — End: 2023-11-12

## 2023-11-11 MED ORDER — DIPHENHYDRAMINE HCL 50 MG/ML IJ SOLN: 12.5000 mg | INTRAMUSCULAR | Status: DC | PRN

## 2023-11-11 MED ORDER — PHENYLEPHRINE 80 MCG/ML (10ML) SYRINGE FOR IV PUSH (FOR BLOOD PRESSURE SUPPORT): 80.0000 ug | PREFILLED_SYRINGE | INTRAVENOUS | Status: DC | PRN

## 2023-11-11 MED ORDER — EPHEDRINE 5 MG/ML INJ: 10.0000 mg | INTRAVENOUS | Status: DC | PRN

## 2023-11-11 MED ORDER — OXYCODONE-ACETAMINOPHEN 5-325 MG PO TABS: 1.0000 | ORAL_TABLET | ORAL | Status: DC | PRN

## 2023-11-11 MED ORDER — OXYTOCIN-SODIUM CHLORIDE 30-0.9 UT/500ML-% IV SOLN: 2.5000 [IU]/h | INTRAVENOUS | Status: DC

## 2023-11-11 MED ORDER — LIDOCAINE HCL (PF) 1 % IJ SOLN: 30.0000 mL | INTRAMUSCULAR | Status: DC | PRN

## 2023-11-11 MED ORDER — FLEET ENEMA RE ENEM
1.0000 | ENEMA | Freq: Every day | RECTAL | Status: DC | PRN
Start: 2023-11-11 — End: 2023-11-11

## 2023-11-11 MED ORDER — MISOPROSTOL 25 MCG QUARTER TABLET: 25.0000 ug | ORAL_TABLET | Freq: Once | ORAL | Status: DC

## 2023-11-11 MED ORDER — FENTANYL-BUPIVACAINE-NACL 0.5-0.125-0.9 MG/250ML-% EP SOLN
12.0000 mL/h | EPIDURAL | Status: DC | PRN
  Filled 2023-11-11: qty 250

## 2023-11-11 MED ORDER — LACTATED RINGERS IV SOLN
500.0000 mL | INTRAVENOUS | Status: DC | PRN
  Administered 2023-11-11: 500 mL via INTRAVENOUS

## 2023-11-11 MED ORDER — OXYCODONE-ACETAMINOPHEN 5-325 MG PO TABS: 2.0000 | ORAL_TABLET | ORAL | Status: DC | PRN

## 2023-11-11 MED ORDER — SOD CITRATE-CITRIC ACID 500-334 MG/5ML PO SOLN: 30.0000 mL | ORAL | Status: DC | PRN

## 2023-11-11 MED ORDER — LACTATED RINGERS IV SOLN: INTRAVENOUS | Status: DC

## 2023-11-11 MED ORDER — HYDROXYZINE HCL 50 MG PO TABS
50.0000 mg | ORAL_TABLET | Freq: Four times a day (QID) | ORAL | Status: DC | PRN
Start: 2023-11-11 — End: 2023-11-11

## 2023-11-11 MED ORDER — OXYTOCIN-SODIUM CHLORIDE 30-0.9 UT/500ML-% IV SOLN: 1.0000 m[IU]/min | INTRAVENOUS | Status: DC

## 2023-11-11 MED ORDER — ONDANSETRON HCL 4 MG/2ML IJ SOLN: 4.0000 mg | Freq: Four times a day (QID) | INTRAMUSCULAR | Status: DC | PRN

## 2023-11-11 MED ORDER — TERBUTALINE SULFATE 1 MG/ML IJ SOLN: 0.2500 mg | Freq: Once | INTRAMUSCULAR | Status: DC | PRN

## 2023-11-11 MED ORDER — TERBUTALINE SULFATE 1 MG/ML IJ SOLN
0.2500 mg | Freq: Once | INTRAMUSCULAR | Status: AC
  Administered 2023-11-11: 0.25 mg via SUBCUTANEOUS

## 2023-11-11 MED ORDER — TERBUTALINE SULFATE 1 MG/ML IJ SOLN: 0.2500 mg | Freq: Once | INTRAMUSCULAR | Status: DC

## 2023-11-11 MED ORDER — OXYTOCIN BOLUS FROM INFUSION: 333.0000 mL | Freq: Once | INTRAVENOUS | Status: DC

## 2023-11-11 MED ORDER — MISOPROSTOL 50MCG HALF TABLET: 50.0000 ug | ORAL_TABLET | Freq: Once | ORAL | Status: DC

## 2023-11-11 MED ORDER — FENTANYL CITRATE (PF) 100 MCG/2ML IJ SOLN: 50.0000 ug | INTRAMUSCULAR | Status: DC | PRN

## 2023-11-11 MED ORDER — MISOPROSTOL 50MCG HALF TABLET
50.0000 ug | ORAL_TABLET | Freq: Once | ORAL | Status: AC
  Administered 2023-11-11: 50 ug via ORAL
  Filled 2023-11-11: qty 1

## 2023-11-11 MED ORDER — ACETAMINOPHEN 325 MG PO TABS: 650.0000 mg | ORAL_TABLET | ORAL | Status: DC | PRN

## 2023-11-11 NOTE — Progress Notes (Signed)
 Patient ID: Kari Campbell, female   DOB: 03-11-1996, 28 y.o.   MRN: [id number] After informed verbal consent, Terbutaline 0.25 mg SQ given, ECV was attempted under Ultrasound guidance.  Forward roll x 2 with success.Aaron Aas   FHR was reactive before and after the procedure.   Pt. Tolerated the procedure well.  Will proceed with IOL

## 2023-11-11 NOTE — Telephone Encounter (Signed)
 Called patient to discuss induction of labor following ECV.  Patient is 40 weeks and this would definitely be an elective procedure.  There are 7 patients waiting for induction that are medically indicated prior to bringing her in.  She can also attempt vaginal breech delivery if she so desires.  There is no real need to bring her and today and so we will call her as soon as we are ready.

## 2023-11-11 NOTE — Progress Notes (Signed)
 LABOR PROGRESS NOTE  Kari Campbell is a 28 y.o. G3P2002 at [redacted]w[redacted]d  admitted for IOL for unstable lie  Subjective: Patient is currently comfortable.  Not feeling contractions.  Objective: BP (!) 118/54   Pulse (!) 109   Temp 98.3 F (36.8 C)   Resp 16   Ht 5\' 7"  (1.702 m)   Wt 92.4 kg   LMP 01/28/2023   SpO2 100%   BMI 31.90 kg/m  or  Vitals:   11/11/23 1810 11/11/23 1915  BP: 129/75 (!) 118/54  Pulse: 86 (!) 109  Resp: 16   Temp: 98.3 F (36.8 C)   SpO2: 100%   Weight: 92.4 kg   Height: 5\' 7"  (1.702 m)      Dilation: 1 Effacement (%): 70 Station: -3 Exam by:: Mavis Gravelle MD FHT: baseline rate 145, moderate  varibility, +acel, no decel Toco: nonme  Labs: Lab Results  Component Value Date   WBC 5.1 11/11/2023   HGB 12.6 11/11/2023   HCT 37.9 11/11/2023   MCV 89.6 11/11/2023   PLT 120 (L) 11/11/2023    Patient Active Problem List   Diagnosis Date Noted   Breech presentation of fetus 11/11/2023   Abnormal fetal presentation 11/09/2023   Family history of congenital heart defect-FOB sister born with heart problem 06/07/2023   Obesity affecting pregnancy, antepartum 06/07/2023    Assessment / Plan: 28 y.o. G3P2002 at [redacted]w[redacted]d here for IOL for unstable lie  Labor: Patient came in for breech presentation.  Successful ECV performed.  SVE 1/70/-3.  Patient agreeable to Foley balloon placement.  Foley balloon placed without difficulty.  Voided with 40 cc of fluid.  Will give 50 mcg oral Cytotec and recheck in 4 hours. Fetal Wellbeing: Category 1, reassuring Pain Control: Patient request Anticipated MOD: No delivery  Janna Melter, MD Attending Family Medicine Physician, [facility] for [facility], [facility]  11/11/2023 7:38 PM

## 2023-11-11 NOTE — H&P (Signed)
 Kari Campbell is an 28 y.o. [phone] [redacted]w[redacted]d female.   Chief Complaint: breech presentation HPI: patient is @ 40 weeks with unstable lie. Here for ECV. Desired epidural for this.   PNC: [facility]  OB History     Gravida  3   Para  2   Term  2   Preterm      AB      Living  2      SAB      IAB      Ectopic      Multiple  0   Live Births  2           Past Medical History:  Diagnosis Date   Asthma    Normal labor and delivery 08/11/2018   Pregnancy 11/17/2019   SVD (spontaneous vaginal delivery) 11/17/2019   Vaginal Pap smear, abnormal     No past surgical history on file.  Family History  Problem Relation Age of Onset   Thyroid  disease Mother    Diabetes Father    Social History:  reports that she has quit smoking. Her smoking use included cigarettes. She has never used smokeless tobacco. She reports that she does not currently use drugs after having used the following drugs: Marijuana. She reports that she does not drink alcohol.   No Known Allergies  Medications Prior to Admission  Medication Sig Dispense Refill   aspirin EC 81 MG tablet Take 81 mg by mouth daily. Swallow whole.     Prenatal Vit-Fe Fumarate-FA (PRENATAL MULTIVITAMIN) TABS tablet Take 1 tablet by mouth daily at 12 noon.       A comprehensive review of systems was negative.  Objective: Last menstrual period 01/28/2023. General appearance: alert, cooperative, and appears stated age Head: Normocephalic, without obvious abnormality, atraumatic Neck: no adenopathy, supple, symmetrical, trachea midline, and thyroid  not enlarged, symmetric, no tenderness/mass/nodules Lungs: normal effort Heart: regular rate and rhythm Abdomen: gravid non-tender Extremities: Homans sign is negative, no sign of DVT Skin: Skin color, texture, turgor normal. No rashes or lesions Neurologic: Grossly normal   Labs: No results found for this or any previous visit (from the past 24 hours).           ABO, Rh:    Antibody:    Rubella:    RPR:    HBsAg:    HIV:    GBS:      Radiology studies: No results found.  Prenatal Transfer Tool  Maternal Diabetes: No Genetic Screening: Declined Maternal Ultrasounds/Referrals: Normal Fetal Ultrasounds or other Referrals:  None Maternal Substance Abuse:  + marijuana Significant Maternal Medications:  Meds include: Other: ASA Significant Maternal Lab Results: Group B Strep negative  Assessment/Plan Principal Problem:   Breech presentation of fetus Active Problems:   Abnormal fetal presentation   #Labor: for IOL #Pain: Natural #FWB: reactive #ID: GBS negative #MOF: formula #MOC: BTL #Circ:  NO #Anemia: Hgb 12.6  For ECV and if successful, will proceed with IOL  Granville Layer 11/11/2023, 5:52 PM

## 2023-11-11 NOTE — Progress Notes (Signed)
 Patient ID: Kari Campbell, female   DOB: [birth year], 28 y.o.   MRN: [id number] Not having much pain  Vitals:   11/11/23 1810 11/11/23 1915  BP: 129/75 (!) 118/54  Pulse: 86 (!) 109  Resp: 16   Temp: 98.3 F (36.8 C) (!) 97.5 F (36.4 C)  TempSrc:  Axillary  SpO2: 100%   Weight: 92.4 kg   Height: 5\' 7"  (1.702 m)    FHR stable  Anticipate increased labor

## 2023-11-12 ENCOUNTER — Encounter (HOSPITAL_COMMUNITY): Payer: Self-pay | Admitting: Family Medicine

## 2023-11-12 DIAGNOSIS — O480 Post-term pregnancy: Secondary | ICD-10-CM

## 2023-11-12 DIAGNOSIS — Z3A40 40 weeks gestation of pregnancy: Secondary | ICD-10-CM | POA: Diagnosis not present

## 2023-11-12 LAB — RPR: RPR Ser Ql: NONREACTIVE

## 2023-11-12 MED ORDER — ONDANSETRON HCL 4 MG/2ML IJ SOLN: 4.0000 mg | INTRAMUSCULAR | Status: DC | PRN

## 2023-11-12 MED ORDER — COCONUT OIL OIL: 1.0000 | TOPICAL_OIL | Status: DC | PRN

## 2023-11-12 MED ORDER — SIMETHICONE 80 MG PO CHEW: 80.0000 mg | CHEWABLE_TABLET | ORAL | Status: DC | PRN

## 2023-11-12 MED ORDER — TERBUTALINE SULFATE 1 MG/ML IJ SOLN: 0.2500 mg | Freq: Once | INTRAMUSCULAR | Status: DC | PRN

## 2023-11-12 MED ORDER — DIBUCAINE (PERIANAL) 1 % EX OINT: 1.0000 | TOPICAL_OINTMENT | CUTANEOUS | Status: DC | PRN

## 2023-11-12 MED ORDER — IBUPROFEN 600 MG PO TABS
600.0000 mg | ORAL_TABLET | Freq: Four times a day (QID) | ORAL | Status: DC
  Administered 2023-11-12 – 2023-11-13 (×5): 600 mg via ORAL
  Filled 2023-11-12 (×5): qty 1

## 2023-11-12 MED ORDER — ACETAMINOPHEN 325 MG PO TABS: 650.0000 mg | ORAL_TABLET | ORAL | Status: DC | PRN

## 2023-11-12 MED ORDER — DIPHENHYDRAMINE HCL 25 MG PO CAPS: 25.0000 mg | ORAL_CAPSULE | Freq: Four times a day (QID) | ORAL | Status: DC | PRN

## 2023-11-12 MED ORDER — PRENATAL MULTIVITAMIN CH
1.0000 | ORAL_TABLET | Freq: Every day | ORAL | Status: DC
Start: 2023-11-12 — End: 2023-11-13
  Administered 2023-11-12 – 2023-11-13 (×2): 1 via ORAL
  Filled 2023-11-12 (×2): qty 1

## 2023-11-12 MED ORDER — OXYTOCIN-SODIUM CHLORIDE 30-0.9 UT/500ML-% IV SOLN
1.0000 m[IU]/min | INTRAVENOUS | Status: DC
  Administered 2023-11-12: 2 m[IU]/min via INTRAVENOUS
  Filled 2023-11-12: qty 500

## 2023-11-12 MED ORDER — SENNOSIDES-DOCUSATE SODIUM 8.6-50 MG PO TABS
2.0000 | ORAL_TABLET | ORAL | Status: DC
  Filled 2023-11-12: qty 2

## 2023-11-12 MED ORDER — BENZOCAINE-MENTHOL 20-0.5 % EX AERO: 1.0000 | INHALATION_SPRAY | CUTANEOUS | Status: DC | PRN

## 2023-11-12 MED ORDER — TETANUS-DIPHTH-ACELL PERTUSSIS 5-2.5-18.5 LF-MCG/0.5 IM SUSY
0.5000 mL | PREFILLED_SYRINGE | Freq: Once | INTRAMUSCULAR | Status: DC
  Filled 2023-11-12: qty 0.5

## 2023-11-12 MED ORDER — WITCH HAZEL-GLYCERIN EX PADS: 1.0000 | MEDICATED_PAD | CUTANEOUS | Status: DC | PRN

## 2023-11-12 MED ORDER — ONDANSETRON HCL 4 MG PO TABS: 4.0000 mg | ORAL_TABLET | ORAL | Status: DC | PRN

## 2023-11-12 MED ORDER — ZOLPIDEM TARTRATE 5 MG PO TABS: 5.0000 mg | ORAL_TABLET | Freq: Every evening | ORAL | Status: DC | PRN

## 2023-11-12 NOTE — Progress Notes (Signed)
 Patient ID: Kari Campbell, female   DOB: 1996-06-06, 28 y.o.   MRN: [id number] Progressing well  Vitals:   11/11/23 1810 11/11/23 1915 11/11/23 2353 11/12/23 0152  BP: 129/75 (!) 118/54 122/61 (!) 108/58  Pulse: 86 (!) 109 88 81  Resp: 16     Temp: 98.3 F (36.8 C) (!) 97.5 F (36.4 C) 97.9 F (36.6 C) 98.1 F (36.7 C)  TempSrc:  Axillary Oral Oral  SpO2: 100%     Weight: 92.4 kg     Height: 5\' 7"  (1.702 m)      FHR 140 Intermittent small variable decels Small accels UCs not tracing well  Dilation: 8.5 Effacement (%): 90 Station: 0 Exam by:: [employer]  Anticipate Second Stage.

## 2023-11-12 NOTE — Progress Notes (Addendum)
 Kari Campbell is a 28 y.o. G3P2002 at [redacted]w[redacted]d by LMP admitted for induction of labor due to unstable lie.  Subjective: Feels well S/p FB that is now out.  Objective: BP 122/61   Pulse 88   Temp 97.9 F (36.6 C) (Oral)   Resp 16   Ht 5\' 7"  (1.702 m)   Wt 92.4 kg   LMP 01/28/2023   SpO2 100%   BMI 31.90 kg/m  No intake/output data recorded. No intake/output data recorded.  FHT:  FHR: 135 bpm, variability: absent,  accelerations:  Present,  decelerations:  Absent UC:   regular, every 4-6 minutes SVE:   Dilation: 4.5 Effacement (%): 70 Station: -3 Exam by:: Gerene Nedd md AROM clear fluid Labs: Lab Results  Component Value Date   WBC 5.1 11/11/2023   HGB 12.6 11/11/2023   HCT 37.9 11/11/2023   MCV 89.6 11/11/2023   PLT 120 (L) 11/11/2023    Assessment / Plan: Induction of labor due to unstable lie,  progressing well on pitocin   Labor: Progressing normally Preeclampsia:  no signs or symptoms of toxicity Fetal Wellbeing:  Category I Pain Control:  Labor support without medications I/D:  n/a Anticipated MOD:  NSVD  Granville Layer, MD 11/12/2023, 12:08 AM

## 2023-11-12 NOTE — Discharge Summary (Shared)
 Postpartum Discharge Summary  Date of Service updated***     Patient Name: Kari Campbell DOB: 1995/10/28 MRN: [id number]  Date of admission: 11/11/2023 Delivery date:11/12/2023 Delivering provider: Harlee Lichtenstein Date of discharge: 11/12/2023  Admitting diagnosis: Breech presentation of fetus [O32.1XX0] Intrauterine pregnancy: [redacted]w[redacted]d     Secondary diagnosis:  Principal Problem:   Breech presentation of fetus Active Problems:   Abnormal fetal presentation  Additional problems: status post Successful Version    Discharge diagnosis: Term Pregnancy Delivered and Breech Presentation, with Successful Version to Vertex                                              Post partum procedures:{Postpartum procedures:23558} Augmentation: AROM, Cytotec, and IP Foley Complications: None  Hospital course: Induction of Labor With Vaginal Delivery   28 y.o. yo G3P3003 at [redacted]w[redacted]d was admitted to the hospital 11/11/2023 for induction of labor.  Indication for induction: Unstable lie.  Patient had an labor course complicated by nothing Membrane Rupture Time/Date: 11:50 PM,11/11/2023  Delivery Method:Vaginal, Spontaneous Operative Delivery:N/A Episiotomy: None Lacerations:  None Details of delivery can be found in separate delivery note.  Patient had a postpartum course complicated by***. Patient is discharged home 11/12/23.  Newborn Data: Birth date:11/12/2023 Birth time:5:31 AM Gender:Female Living status:Living Apgars:8 ,9  Weight:3360 g  Magnesium Sulfate received: No BMZ received: No Rhophylac:N/A MMR:N/A T-DaP:{Tdap:23962} Flu: {UXL:24401} RSV Vaccine received: {RSV:31013} Transfusion:{Transfusion received:30440034} Immunizations administered: Immunization History  Administered Date(s) Administered   MMR 08/14/2018    Physical exam  Vitals:   11/12/23 0152 11/12/23 0550 11/12/23 0600 11/12/23 0605  BP: (!) 108/58 126/65 (!) 121/102 129/64  Pulse: 81 93 99 97  Resp:       Temp: 98.1 F (36.7 C)     TempSrc: Oral     SpO2:      Weight:      Height:       General: {Exam; general:21111117} Lochia: {Desc; appropriate/inappropriate:30686::"appropriate"} Uterine Fundus: {Desc; firm/soft:30687} Incision: {Exam; incision:21111123} DVT Evaluation: {Exam; dvt:2111122} Labs: Lab Results  Component Value Date   WBC 5.1 11/11/2023   HGB 12.6 11/11/2023   HCT 37.9 11/11/2023   MCV 89.6 11/11/2023   PLT 120 (L) 11/11/2023      Latest Ref Rng & Units 12/20/2021    3:37 AM  CMP  Glucose 70 - 99 mg/dL 027   BUN 6 - 20 mg/dL 15   Creatinine 2.53 - 1.00 mg/dL 6.64   Sodium 403 - 474 mmol/L 140   Potassium 3.5 - 5.1 mmol/L 3.2   Chloride 98 - 111 mmol/L 105   CO2 22 - 32 mmol/L 25   Calcium 8.9 - 10.3 mg/dL 9.3    Edinburgh Score:    11/18/2019    5:46 PM  Edinburgh Postnatal Depression Scale Screening Tool  I have been able to laugh and see the funny side of things. 0  I have looked forward with enjoyment to things. 0  I have blamed myself unnecessarily when things went wrong. 0  I have been anxious or worried for no good reason. 0  I have felt scared or panicky for no good reason. 0  Things have been getting on top of me. 1  I have been so unhappy that I have had difficulty sleeping. 0  I have felt sad or miserable. 0  I have been  so unhappy that I have been crying. 0  The thought of harming myself has occurred to me. 0  Edinburgh Postnatal Depression Scale Total 1      After visit meds:  Allergies as of 11/12/2023   No Known Allergies   Med Rec must be completed prior to using this [facility]***        Discharge home in stable condition Infant Feeding: {Baby feeding:23562} Infant Disposition:{CHL IP OB HOME WITH WUJWJX:91478} Discharge instruction: per After Visit Summary and Postpartum booklet. Activity: Advance as tolerated. Pelvic rest for 6 weeks.  Diet: routine diet Anticipated Birth Control: {Birth Control:23956} Postpartum  Appointment:{Outpatient follow up:23559} Additional Postpartum F/U: {PP Procedure:23957} Future Appointments: Future Appointments  Date Time Provider Department Center  11/13/2023  9:15 AM [facility]   Follow up Visit:      11/12/2023 Holmes Lusher, CNM

## 2023-11-13 ENCOUNTER — Other Ambulatory Visit

## 2023-11-13 ENCOUNTER — Encounter (HOSPITAL_COMMUNITY): Payer: Self-pay | Admitting: Family Medicine

## 2023-11-13 ENCOUNTER — Inpatient Hospital Stay (HOSPITAL_COMMUNITY)

## 2023-11-13 ENCOUNTER — Encounter (HOSPITAL_COMMUNITY)
Admission: RE | Disposition: A | Discharge status: Home / Self Care | Payer: Self-pay | Source: Home / Self Care | Attending: Family Medicine

## 2023-11-13 DIAGNOSIS — Z302 Encounter for sterilization: Secondary | ICD-10-CM

## 2023-11-13 DIAGNOSIS — Z9851 Tubal ligation status: Secondary | ICD-10-CM

## 2023-11-13 HISTORY — PX: TUBAL LIGATION: SHX77

## 2023-11-13 LAB — CBC
HCT: 32 % — ABNORMAL LOW (ref 36.0–46.0)
Hemoglobin: 10.5 g/dL — ABNORMAL LOW (ref 12.0–15.0)
MCH: 29.7 pg (ref 26.0–34.0)
MCHC: 32.8 g/dL (ref 30.0–36.0)
MCV: 90.7 fL (ref 80.0–100.0)
Platelets: 119 10*3/uL — ABNORMAL LOW (ref 150–400)
RBC: 3.53 MIL/uL — ABNORMAL LOW (ref 3.87–5.11)
RDW: 16 % — ABNORMAL HIGH (ref 11.5–15.5)
WBC: 8.4 10*3/uL (ref 4.0–10.5)
nRBC: 0 % (ref 0.0–0.2)

## 2023-11-13 SURGERY — LIGATION, FALLOPIAN TUBE, POSTPARTUM
Anesthesia: Spinal | Laterality: Bilateral

## 2023-11-13 MED ORDER — FAMOTIDINE 20 MG PO TABS
40.0000 mg | ORAL_TABLET | Freq: Once | ORAL | Status: AC
  Administered 2023-11-13: 40 mg via ORAL
  Filled 2023-11-13: qty 2

## 2023-11-13 MED ORDER — OXYCODONE HCL 5 MG PO TABS: 5.0000 mg | ORAL_TABLET | Freq: Once | ORAL | Status: DC | PRN

## 2023-11-13 MED ORDER — ONDANSETRON HCL 4 MG/2ML IJ SOLN
INTRAMUSCULAR | Status: DC | PRN
  Administered 2023-11-13: 4 mg via INTRAVENOUS

## 2023-11-13 MED ORDER — FENTANYL CITRATE (PF) 100 MCG/2ML IJ SOLN
INTRAMUSCULAR | Status: DC | PRN
  Administered 2023-11-13: 85 ug via INTRAVENOUS

## 2023-11-13 MED ORDER — BUPIVACAINE HCL (PF) 0.75 % IJ SOLN
INTRAMUSCULAR | Status: DC | PRN
  Administered 2023-11-13: 1.4 mL via INTRATHECAL

## 2023-11-13 MED ORDER — PHENYLEPHRINE HCL (PRESSORS) 10 MG/ML IV SOLN
INTRAVENOUS | Status: DC | PRN
Start: 2023-11-13 — End: 2023-11-13
  Administered 2023-11-13: 80 ug via INTRAVENOUS

## 2023-11-13 MED ORDER — OXYCODONE-ACETAMINOPHEN 5-325 MG PO TABS: 1.0000 | ORAL_TABLET | ORAL | 0 refills | Status: AC | PRN

## 2023-11-13 MED ORDER — ACETAMINOPHEN 10 MG/ML IV SOLN
INTRAVENOUS | Status: AC
  Filled 2023-11-13: qty 100

## 2023-11-13 MED ORDER — FENTANYL CITRATE (PF) 100 MCG/2ML IJ SOLN: 25.0000 ug | INTRAMUSCULAR | Status: DC | PRN

## 2023-11-13 MED ORDER — DEXMEDETOMIDINE HCL IN NACL 80 MCG/20ML IV SOLN
INTRAVENOUS | Status: DC | PRN
Start: 2023-11-13 — End: 2023-11-13
  Administered 2023-11-13: 8 ug via INTRAVENOUS

## 2023-11-13 MED ORDER — DEXAMETHASONE SODIUM PHOSPHATE 10 MG/ML IJ SOLN
INTRAMUSCULAR | Status: DC | PRN
  Administered 2023-11-13: 10 mg via INTRAVENOUS

## 2023-11-13 MED ORDER — STERILE WATER FOR IRRIGATION IR SOLN
Status: DC | PRN
  Administered 2023-11-13: 1000 mL

## 2023-11-13 MED ORDER — IBUPROFEN 600 MG PO TABS: 600.0000 mg | ORAL_TABLET | Freq: Four times a day (QID) | ORAL | 0 refills | Status: AC

## 2023-11-13 MED ORDER — SODIUM CHLORIDE 0.9% FLUSH
3.0000 mL | Freq: Two times a day (BID) | INTRAVENOUS | Status: DC
  Administered 2023-11-13: 10 mL via INTRAVENOUS

## 2023-11-13 MED ORDER — ACETAMINOPHEN 10 MG/ML IV SOLN: 1000.0000 mg | Freq: Once | INTRAVENOUS | Status: DC | PRN

## 2023-11-13 MED ORDER — FENTANYL CITRATE (PF) 100 MCG/2ML IJ SOLN
INTRAMUSCULAR | Status: DC | PRN
  Administered 2023-11-13: 15 ug via INTRATHECAL

## 2023-11-13 MED ORDER — SODIUM CHLORIDE 0.9% FLUSH: 3.0000 mL | INTRAVENOUS | Status: DC | PRN

## 2023-11-13 MED ORDER — ONDANSETRON HCL 4 MG/2ML IJ SOLN: 4.0000 mg | Freq: Once | INTRAMUSCULAR | Status: DC | PRN

## 2023-11-13 MED ORDER — ACETAMINOPHEN 10 MG/ML IV SOLN
INTRAVENOUS | Status: DC | PRN
  Administered 2023-11-13: 1000 mg via INTRAVENOUS

## 2023-11-13 MED ORDER — DEXMEDETOMIDINE HCL IN NACL 80 MCG/20ML IV SOLN
INTRAVENOUS | Status: AC
  Filled 2023-11-13: qty 20

## 2023-11-13 MED ORDER — LACTATED RINGERS IV SOLN: INTRAVENOUS | Status: DC | PRN

## 2023-11-13 MED ORDER — FENTANYL CITRATE (PF) 100 MCG/2ML IJ SOLN
INTRAMUSCULAR | Status: AC
  Filled 2023-11-13: qty 2

## 2023-11-13 MED ORDER — OXYCODONE HCL 5 MG/5ML PO SOLN: 5.0000 mg | Freq: Once | ORAL | Status: DC | PRN

## 2023-11-13 MED ORDER — METOCLOPRAMIDE HCL 10 MG PO TABS
10.0000 mg | ORAL_TABLET | Freq: Once | ORAL | Status: AC
  Administered 2023-11-13: 10 mg via ORAL
  Filled 2023-11-13: qty 1

## 2023-11-13 SURGICAL SUPPLY — 27 items
BLADE SURG 15 STRL LF C SS BP (BLADE) ×1 IMPLANT
DISSECTOR SURG LIGASURE 21 (MISCELLANEOUS) ×1 IMPLANT
DRSG OPSITE POSTOP 3X4 (GAUZE/BANDAGES/DRESSINGS) ×1 IMPLANT
DURAPREP 26ML APPLICATOR (WOUND CARE) ×1 IMPLANT
GAUZE SPONGE 2X2 STRL 8-PLY (GAUZE/BANDAGES/DRESSINGS) ×1 IMPLANT
GLOVE BIO SURGEON STRL SZ7.5 (GLOVE) ×1 IMPLANT
GLOVE BIOGEL PI IND STRL 7.0 (GLOVE) ×1 IMPLANT
GLOVE BIOGEL PI IND STRL 8 (GLOVE) ×1 IMPLANT
GOWN STRL REUS W/TWL LRG LVL3 (GOWN DISPOSABLE) ×1 IMPLANT
GOWN STRL REUS W/TWL XL LVL3 (GOWN DISPOSABLE) ×1 IMPLANT
LIGASURE IMPACT 36 18CM CVD LR (INSTRUMENTS) ×1 IMPLANT
MAT PREVALON FULL STRYKER (MISCELLANEOUS) IMPLANT
NEEDLE HYPO 22GX1.5 SAFETY (NEEDLE) ×1 IMPLANT
NS IRRIG 1000ML POUR BTL (IV SOLUTION) ×1 IMPLANT
PACK ABDOMINAL MINOR (CUSTOM PROCEDURE TRAY) ×1 IMPLANT
PROTECTOR NERVE ULNAR (MISCELLANEOUS) ×1 IMPLANT
RETRACTOR WOUND ALXS 19CM XSML (INSTRUMENTS) IMPLANT
SPONGE LAP 4X18 RFD (DISPOSABLE) ×1 IMPLANT
SUT MNCRL AB 3-0 PS2 27 (SUTURE) ×1 IMPLANT
SUT MON AB 2-0 CT1 36 (SUTURE) IMPLANT
SUT PLAIN 0 NONE (SUTURE) IMPLANT
SUT VIC AB 0 CTX36XBRD ANBCTRL (SUTURE) IMPLANT
SUT VICRYL 0 UR6 27IN ABS (SUTURE) ×1 IMPLANT
SYR CONTROL 10ML LL (SYRINGE) ×1 IMPLANT
TOWEL OR 17X24 6PK STRL BLUE (TOWEL DISPOSABLE) ×2 IMPLANT
TRAY FOLEY W/BAG SLVR 14FR (SET/KITS/TRAYS/PACK) ×1 IMPLANT
WATER STERILE IRR 1000ML POUR (IV SOLUTION) ×1 IMPLANT

## 2023-11-13 NOTE — Plan of Care (Signed)

## 2023-11-13 NOTE — Discharge Instructions (Signed)

## 2023-11-13 NOTE — Transfer of Care (Signed)
 Immediate Anesthesia Transfer of Care Note  Patient: Kari Campbell  Procedure(s) Performed: LIGATION, FALLOPIAN TUBE, POSTPARTUM (Bilateral)  Patient Location: PACU  Anesthesia Type:Spinal  Level of Consciousness: awake, alert , and oriented  Airway & Oxygen Therapy: Patient Spontanous Breathing  Post-op Assessment: Report given to RN and Post -op Vital signs reviewed and stable  Post vital signs: Reviewed and stable  Last Vitals:  Vitals Value Taken Time  BP 93/60 11/13/23 1003  Temp    Pulse 68 11/13/23 1005  Resp 12 11/13/23 1005  SpO2 99 % 11/13/23 1005  Vitals shown include unfiled device data.  Last Pain:  Vitals:   11/13/23 0811  TempSrc:   PainSc: 0-No pain         Complications: No notable events documented.

## 2023-11-13 NOTE — Op Note (Signed)
 Kari Campbell 11/13/2023  PREOPERATIVE DIAGNOSES: Multiparity, undesired fertility  POSTOPERATIVE DIAGNOSES: Multiparity, undesired fertility  PROCEDURE:  Postpartum Bilateral Partial Salpingectomy  SURGEON: Dr. Ferdie Housekeeper & Dr. Darrow End  ANESTHESIA:  Epidural and local analgesia using 0.5% Bupivicaine  COMPLICATIONS:  None immediate.  ESTIMATED BLOOD LOSS: 25 ml.  INDICATIONS:  28 y.o. [id number] with undesired fertility, status post vaginal delivery, desires permanent sterilization.  Other reversible forms of contraception were discussed with patient; she declines all other modalities. Risks of procedure discussed with patient including but not limited to: risk of regret, permanence of method, bleeding, infection, injury to surrounding organs and need for additional procedures.  Failure risk of 1% with increased risk of ectopic gestation if pregnancy occurs was also discussed with patient.      FINDINGS:  Normal uterus, tubes, and ovaries.  PROCEDURE DETAILS: The patient was taken to the operating room where her epidural anesthesia was dosed up to surgical level and found to be adequate.  She was then placed in the dorsal supine position and prepped and draped in sterile fashion.  After an adequate timeout was performed, attention was turned to the patient's abdomen where a small transverse skin incision was made under the umbilical fold. The incision was taken down to the layer of fascia using the scalpel, and fascia was incised, and extended bilaterally using Mayo scissors. The peritoneum was entered in a blunt fashion. Trula Gable was used for adequate visualization of uterus and tubes. Attention was then turned to the patient's uterus, the right fallopian tube was grasped with Babcock clamps and followed out to the fimbriated end. The distal 5 cm portion of the tube and mesosalpinx was removed with Liga sure cautery. The pedicle was inspected and found to be hemostatic.  A  similar process was carried out on the left side allowing for bilateral tubal sterilization.  Good hemostasis was noted overall. The instruments were then removed from the patient's abdomen and the fascial incision was repaired with 0 Vicryl, and the skin was closed with a 4-0 Vicryl subcuticular stitch. The patient tolerated the procedure well.  Instrument, sponge, and needle counts were correct times two.  The patient was then taken to the recovery room awake and in stable condition.   Darrow End, MD FMOB Fellow, Faculty practice [facility], Center for [facility]

## 2023-11-13 NOTE — Plan of Care (Signed)
 Problem: Education: Goal: Knowledge of General Education information will improve Description: Including pain rating scale, medication(s)/side effects and non-pharmacologic comfort measures 11/13/2023 1452 by Osvaldo Blender, LPN Outcome: Adequate for Discharge 11/13/2023 0710 by Osvaldo Blender, LPN Outcome: Progressing   Problem: Health Behavior/Discharge Planning: Goal: Ability to manage health-related needs will improve 11/13/2023 1452 by Osvaldo Blender, LPN Outcome: Adequate for Discharge 11/13/2023 0710 by Osvaldo Blender, LPN Outcome: Progressing   Problem: Clinical Measurements: Goal: Ability to maintain clinical measurements within normal limits will improve 11/13/2023 1452 by Osvaldo Blender, LPN Outcome: Adequate for Discharge 11/13/2023 0710 by Osvaldo Blender, LPN Outcome: Progressing Goal: Will remain free from infection 11/13/2023 1452 by Osvaldo Blender, LPN Outcome: Adequate for Discharge 11/13/2023 0710 by Osvaldo Blender, LPN Outcome: Progressing Goal: Diagnostic test results will improve 11/13/2023 1452 by Osvaldo Blender, LPN Outcome: Adequate for Discharge 11/13/2023 0710 by Osvaldo Blender, LPN Outcome: Progressing Goal: Respiratory complications will improve 11/13/2023 1452 by Osvaldo Blender, LPN Outcome: Adequate for Discharge 11/13/2023 0710 by Osvaldo Blender, LPN Outcome: Progressing Goal: Cardiovascular complication will be avoided 11/13/2023 1452 by Osvaldo Blender, LPN Outcome: Adequate for Discharge 11/13/2023 0710 by Osvaldo Blender, LPN Outcome: Progressing   Problem: Activity: Goal: Risk for activity intolerance will decrease 11/13/2023 1452 by Osvaldo Blender, LPN Outcome: Adequate for Discharge 11/13/2023 0710 by Osvaldo Blender, LPN Outcome: Progressing   Problem: Nutrition: Goal: Adequate nutrition will be maintained 11/13/2023 1452 by Osvaldo Blender, LPN Outcome: Adequate for Discharge 11/13/2023 0710 by Osvaldo Blender, LPN Outcome: Progressing    Problem: Coping: Goal: Level of anxiety will decrease 11/13/2023 1452 by Osvaldo Blender, LPN Outcome: Adequate for Discharge 11/13/2023 0710 by Osvaldo Blender, LPN Outcome: Progressing   Problem: Elimination: Goal: Will not experience complications related to bowel motility 11/13/2023 1452 by Osvaldo Blender, LPN Outcome: Adequate for Discharge 11/13/2023 0710 by Osvaldo Blender, LPN Outcome: Progressing Goal: Will not experience complications related to urinary retention 11/13/2023 1452 by Osvaldo Blender, LPN Outcome: Adequate for Discharge 11/13/2023 0710 by Osvaldo Blender, LPN Outcome: Progressing   Problem: Pain Managment: Goal: General experience of comfort will improve and/or be controlled 11/13/2023 1452 by Osvaldo Blender, LPN Outcome: Adequate for Discharge 11/13/2023 0710 by Osvaldo Blender, LPN Outcome: Progressing   Problem: Safety: Goal: Ability to remain free from injury will improve 11/13/2023 1452 by Osvaldo Blender, LPN Outcome: Adequate for Discharge 11/13/2023 0710 by Osvaldo Blender, LPN Outcome: Progressing   Problem: Skin Integrity: Goal: Risk for impaired skin integrity will decrease 11/13/2023 1452 by Osvaldo Blender, LPN Outcome: Adequate for Discharge 11/13/2023 0710 by Osvaldo Blender, LPN Outcome: Progressing   Problem: Education: Goal: Knowledge of Childbirth will improve 11/13/2023 1452 by Osvaldo Blender, LPN Outcome: Adequate for Discharge 11/13/2023 0710 by Osvaldo Blender, LPN Outcome: Progressing Goal: Ability to make informed decisions regarding treatment and plan of care will improve 11/13/2023 1452 by Osvaldo Blender, LPN Outcome: Adequate for Discharge 11/13/2023 0710 by Osvaldo Blender, LPN Outcome: Progressing Goal: Ability to state and carry out methods to decrease the pain will improve 11/13/2023 1452 by Osvaldo Blender, LPN Outcome: Adequate for Discharge 11/13/2023 0710 by Osvaldo Blender, LPN Outcome: Progressing Goal: Individualized Educational  Video(s) 11/13/2023 1452 by Osvaldo Blender, LPN Outcome: Adequate for Discharge 11/13/2023 0710 by Osvaldo Blender, LPN Outcome: Progressing   Problem: Coping: Goal: Ability to verbalize  concerns and feelings about labor and delivery will improve 11/13/2023 1452 by Osvaldo Blender, LPN Outcome: Adequate for Discharge 11/13/2023 0710 by Osvaldo Blender, LPN Outcome: Progressing   Problem: Life Cycle: Goal: Ability to make normal progression through stages of labor will improve 11/13/2023 1452 by Osvaldo Blender, LPN Outcome: Adequate for Discharge 11/13/2023 0710 by Osvaldo Blender, LPN Outcome: Progressing Goal: Ability to effectively push during vaginal delivery will improve 11/13/2023 1452 by Osvaldo Blender, LPN Outcome: Adequate for Discharge 11/13/2023 0710 by Osvaldo Blender, LPN Outcome: Progressing   Problem: Role Relationship: Goal: Will demonstrate positive interactions with the child 11/13/2023 1452 by Osvaldo Blender, LPN Outcome: Adequate for Discharge 11/13/2023 0710 by Osvaldo Blender, LPN Outcome: Progressing   Problem: Safety: Goal: Risk of complications during labor and delivery will decrease 11/13/2023 1452 by Osvaldo Blender, LPN Outcome: Adequate for Discharge 11/13/2023 0710 by Osvaldo Blender, LPN Outcome: Progressing   Problem: Pain Management: Goal: Relief or control of pain from uterine contractions will improve 11/13/2023 1452 by Osvaldo Blender, LPN Outcome: Adequate for Discharge 11/13/2023 0710 by Osvaldo Blender, LPN Outcome: Progressing   Problem: Education: Goal: Knowledge of condition will improve 11/13/2023 1452 by Osvaldo Blender, LPN Outcome: Adequate for Discharge 11/13/2023 0710 by Osvaldo Blender, LPN Outcome: Progressing Goal: Individualized Educational Video(s) 11/13/2023 1452 by Osvaldo Blender, LPN Outcome: Adequate for Discharge 11/13/2023 0710 by Osvaldo Blender, LPN Outcome: Progressing Goal: Individualized Newborn Educational Video(s) 11/13/2023  1452 by Osvaldo Blender, LPN Outcome: Adequate for Discharge 11/13/2023 0710 by Osvaldo Blender, LPN Outcome: Progressing   Problem: Activity: Goal: Will verbalize the importance of balancing activity with adequate rest periods 11/13/2023 1452 by Osvaldo Blender, LPN Outcome: Adequate for Discharge 11/13/2023 0710 by Osvaldo Blender, LPN Outcome: Progressing Goal: Ability to tolerate increased activity will improve 11/13/2023 1452 by Osvaldo Blender, LPN Outcome: Adequate for Discharge 11/13/2023 0710 by Osvaldo Blender, LPN Outcome: Progressing   Problem: Coping: Goal: Ability to identify and utilize available resources and services will improve 11/13/2023 1452 by Osvaldo Blender, LPN Outcome: Adequate for Discharge 11/13/2023 0710 by Osvaldo Blender, LPN Outcome: Progressing   Problem: Life Cycle: Goal: Chance of risk for complications during the postpartum period will decrease 11/13/2023 1452 by Osvaldo Blender, LPN Outcome: Adequate for Discharge 11/13/2023 0710 by Osvaldo Blender, LPN Outcome: Progressing   Problem: Role Relationship: Goal: Ability to demonstrate positive interaction with newborn will improve 11/13/2023 1452 by Osvaldo Blender, LPN Outcome: Adequate for Discharge 11/13/2023 0710 by Osvaldo Blender, LPN Outcome: Progressing   Problem: Skin Integrity: Goal: Demonstration of wound healing without infection will improve 11/13/2023 1452 by Osvaldo Blender, LPN Outcome: Adequate for Discharge 11/13/2023 0710 by Osvaldo Blender, LPN Outcome: Progressing

## 2023-11-13 NOTE — Anesthesia Preprocedure Evaluation (Signed)
 Anesthesia Evaluation  Patient identified by MRN, date of birth, ID band Patient awake    Reviewed: Allergy & Precautions, NPO status , Patient's Chart, lab work & pertinent test results, reviewed documented beta blocker date and time   History of Anesthesia Complications Negative for: history of anesthetic complications  Airway Mallampati: II  TM Distance: >3 FB     Dental no notable dental hx.    Pulmonary neg shortness of breath, asthma , neg sleep apnea, neg COPD, neg recent URI, former smoker   breath sounds clear to auscultation       Cardiovascular (-) angina (-) CAD, (-) Past MI and (-) Cardiac Stents  Rhythm:Regular Rate:Normal     Neuro/Psych neg Seizures    GI/Hepatic ,neg GERD  ,,(+) neg Cirrhosis        Endo/Other    Renal/GU Renal disease     Musculoskeletal   Abdominal   Peds  Hematology  (+) Blood dyscrasia, anemia 10.5/119   Anesthesia Other Findings   Reproductive/Obstetrics                              Anesthesia Physical Anesthesia Plan  ASA: 2  Anesthesia Plan: Spinal   Post-op Pain Management:    Induction: Intravenous  PONV Risk Score and Plan: 1 and Ondansetron   Airway Management Planned: Nasal Cannula and Natural Airway  Additional Equipment:   Intra-op Plan:   Post-operative Plan:   Informed Consent: I have reviewed the patients History and Physical, chart, labs and discussed the procedure including the risks, benefits and alternatives for the proposed anesthesia with the patient or authorized representative who has indicated his/her understanding and acceptance.     Dental advisory given  Plan Discussed with: CRNA  Anesthesia Plan Comments:          Anesthesia Quick Evaluation

## 2023-11-13 NOTE — Social Work (Signed)
CSW received consult for hx of marijuana use.  Referral was screened out due to the following:  ~MOB had no documented substance use after initial prenatal visit/+UPT.  ~MOB had no positive drug screens after initial prenatal visit/+UPT.  ~Baby's UDS is negative.  Please consult CSW if current concerns arise or by MOB's request.  CSW will monitor CDS results and make report to Child Protective Services if warranted.  Amonda Brillhart, LCSWA Clinical Social Worker [phone]  

## 2023-11-13 NOTE — Progress Notes (Signed)
Patient desires permanent sterilization.  Other reversible forms of contraception were discussed with patient; she declines all other modalities. Risks of procedure discussed with patient including but not limited to: risk of regret, permanence of method, bleeding, infection, injury to surrounding organs and need for additional procedures.  Failure risk of about 1% with increased risk of ectopic gestation if pregnancy occurs was also discussed with patient.  Also discussed possibility of post-tubal pain syndrome. The patient concurred with the proposed plan, giving informed written consent for the procedures.  Patient has been NPO since last night she will remain NPO for procedure. Anesthesia and OR aware.  

## 2023-11-13 NOTE — Anesthesia Postprocedure Evaluation (Signed)
 Anesthesia Post Note  Patient: Floetta Brickey  Procedure(s) Performed: LIGATION, FALLOPIAN TUBE, POSTPARTUM (Bilateral)     Patient location during evaluation: PACU Anesthesia Type: Spinal Level of consciousness: awake and alert Pain management: pain level controlled Vital Signs Assessment: post-procedure vital signs reviewed and stable Respiratory status: spontaneous breathing, nonlabored ventilation, respiratory function stable and patient connected to nasal cannula oxygen Cardiovascular status: blood pressure returned to baseline and stable Postop Assessment: no apparent nausea or vomiting Anesthetic complications: no   No notable events documented.  Last Vitals:  Vitals:   11/13/23 1101 11/13/23 1115  BP: (!) 101/59 (!) 95/59  Pulse: 71 67  Resp: 14 15  Temp: 36.4 C   SpO2: 98% 97%    Last Pain:  Vitals:   11/13/23 1101  TempSrc: Oral  PainSc: 0-No pain   Pain Goal:    LLE Motor Response: Purposeful movement (11/13/23 1101)   RLE Motor Response: No movement due to regional block (11/13/23 1101)       Epidural/Spinal Function Cutaneous sensation:  (can wiggle toes on left) (11/13/23 1101), Patient able to flex knees: No (11/13/23 1101), Patient able to lift hips off bed: No (11/13/23 1101), Back pain beyond tenderness at insertion site: No (11/13/23 1101), Progressively worsening motor and/or sensory loss: No (11/13/23 1101), Bowel and/or bladder incontinence post epidural: No (11/13/23 1101)  Leslye Rast

## 2023-11-13 NOTE — Anesthesia Procedure Notes (Signed)
 Spinal  Patient location during procedure: OR Start time: 11/13/2023 9:05 AM End time: 11/13/2023 9:08 AM Reason for block: surgical anesthesia Staffing Performed: anesthesiologist  Anesthesiologist: Leslye Rast, MD Performed by: Leslye Rast, MD Authorized by: Leslye Rast, MD   Preanesthetic Checklist Completed: patient identified, IV checked, site marked, risks and benefits discussed, surgical consent, monitors and equipment checked, pre-op evaluation and timeout performed Spinal Block Patient position: sitting Prep: DuraPrep Patient monitoring: heart rate, cardiac monitor, continuous pulse ox and blood pressure Approach: midline Location: L3-4 Injection technique: single-shot Needle Needle type: Sprotte  Needle gauge: 24 G Needle length: 9 cm Assessment Sensory level: T4 Events: CSF return

## 2023-11-15 LAB — SURGICAL PATHOLOGY

## 2023-11-16 ENCOUNTER — Telehealth: Payer: Self-pay

## 2023-11-16 NOTE — Telephone Encounter (Signed)
 Left message for pt to call office back regarding incision check appt.

## 2023-11-22 ENCOUNTER — Telehealth (HOSPITAL_COMMUNITY): Payer: Self-pay | Admitting: *Deleted

## 2023-11-22 NOTE — Telephone Encounter (Signed)
 11/22/2023  Name: Kari Campbell MRN: [id number] DOB: 1996/01/17  Reason for Call:  Transition of Care Hospital Discharge Call  Contact Status: Patient Contact Status: Message  Language assistant needed:          Follow-Up Questions:    Dimple Francis Postnatal Depression Scale:  In the Past 7 Days:    PHQ2-9 Depression Scale:     Discharge Follow-up:    Post-discharge interventions: NA  Pearlie Bougie, RN 11/22/2023 14:49
